# Patient Record
Sex: Male | Born: 1935 | Race: White | Hispanic: No | Smoking: Former smoker
Health system: Southern US, Community
[De-identification: ages and names within clinical notes are randomized; demographics above are authoritative.]

## PROBLEM LIST (undated history)

## (undated) DIAGNOSIS — C4490 Unspecified malignant neoplasm of skin, unspecified: Secondary | ICD-10-CM

## (undated) DIAGNOSIS — H9319 Tinnitus, unspecified ear: Secondary | ICD-10-CM

## (undated) DIAGNOSIS — C801 Malignant (primary) neoplasm, unspecified: Secondary | ICD-10-CM

## (undated) HISTORY — DX: Tinnitus, unspecified ear: H93.19

## (undated) HISTORY — PX: EYE SURGERY: SHX253

## (undated) HISTORY — PX: APPENDECTOMY: SHX54

## (undated) HISTORY — PX: COLONOSCOPY: SHX174

## (undated) HISTORY — DX: Unspecified malignant neoplasm of skin, unspecified: C44.90

## (undated) HISTORY — DX: Malignant (primary) neoplasm, unspecified: C80.1

## (undated) HISTORY — PX: TONSILLECTOMY: SUR1361

---

## 2002-11-23 ENCOUNTER — Ambulatory Visit (HOSPITAL_COMMUNITY): Admission: RE | Admit: 2002-11-23 | Discharge: 2002-11-23 | Payer: Self-pay | Admitting: *Deleted

## 2002-11-23 ENCOUNTER — Encounter: Payer: Self-pay | Admitting: *Deleted

## 2015-12-07 ENCOUNTER — Other Ambulatory Visit: Payer: Self-pay | Admitting: Otolaryngology

## 2015-12-07 ENCOUNTER — Other Ambulatory Visit: Payer: Self-pay | Admitting: Specialist

## 2015-12-07 ENCOUNTER — Other Ambulatory Visit (HOSPITAL_COMMUNITY)
Admission: RE | Admit: 2015-12-07 | Discharge: 2015-12-07 | Disposition: A | Discharge status: Home / Self Care | Payer: Medicare Other | Source: Ambulatory Visit | Attending: Otolaryngology | Admitting: Otolaryngology

## 2015-12-07 ENCOUNTER — Ambulatory Visit
Admission: RE | Admit: 2015-12-07 | Discharge: 2015-12-07 | Disposition: A | Discharge status: Home / Self Care | Payer: Medicare Other | Source: Ambulatory Visit | Attending: Otolaryngology | Admitting: Otolaryngology

## 2015-12-07 DIAGNOSIS — K118 Other diseases of salivary glands: Secondary | ICD-10-CM

## 2015-12-07 DIAGNOSIS — R220 Localized swelling, mass and lump, head: Secondary | ICD-10-CM | POA: Diagnosis present

## 2015-12-07 MED ORDER — IOPAMIDOL (ISOVUE-300) INJECTION 61%
75.0000 mL | Freq: Once | INTRAVENOUS | Status: AC | PRN
  Administered 2015-12-07: 75 mL via INTRAVENOUS

## 2015-12-14 ENCOUNTER — Ambulatory Visit
Admission: RE | Admit: 2015-12-14 | Discharge: 2015-12-14 | Disposition: A | Discharge status: Home / Self Care | Payer: Medicare Other | Source: Ambulatory Visit | Attending: Otolaryngology | Admitting: Otolaryngology

## 2015-12-14 ENCOUNTER — Other Ambulatory Visit: Payer: Self-pay | Admitting: Otolaryngology

## 2015-12-14 DIAGNOSIS — Z01818 Encounter for other preprocedural examination: Secondary | ICD-10-CM

## 2015-12-14 NOTE — H&P (Signed)
PREOPERATIVE H&P  Chief Complaint: right parotid mass  HPI: Zachary Lin is a 82 y.o. male who presents for evaluation of slowly enlarging right parotid mass. He first noticed this about a year ago. It's gradually gotten larger. It's not painful and he has normal facial nerve function. FNA of the mass last week was suspicious for malignancy and CT scan  demonstrated a 4 cm parotid mass also suspicious for malignancy. There was no adenopathy in the neck noted on CT scan. Patient gives no history of skin cancers removed the the face or scalp. He's taken to the OR for right parotidectomy with facial nerve dissection.  No past medical history on file. No past surgical history on file. Social History   Social History  . Marital status: Married    Spouse name: N/A  . Number of children: N/A  . Years of education: N/A   Social History Main Topics  . Smoking status: Not on file  . Smokeless tobacco: Not on file  . Alcohol use Not on file  . Drug use: Unknown  . Sexual activity: Not on file   Other Topics Concern  . Not on file   Social History Narrative  . No narrative on file   No family history on file. Allergies not on file Prior to Admission medications   Not on File     Positive ROS: negative  All other systems have been reviewed and were otherwise negative with the exception of those mentioned in the HPI and as above.  Physical Exam: There were no vitals filed for this visit.  General: Alert, no acute distress Oral: Normal oral mucosa and tonsils. IDL clear hypopharynx and larynx Nasal: Clear nasal passages Neck: No palpable adenopathy or thyroid nodules. 3-4 cm right parotid mass Ear: Ear canal is clear with normal appearing TMs Cardiovascular: Regular rate and rhythm, no murmur.  Respiratory: Clear to auscultation Neurologic: Alert and oriented x 3. Normal facial nerve function   Assessment/Plan: right parotid tumor Plan for Procedure(s): RIGHT PAROTIDECTOMY  WITH FACIAL NERVE DISSECTION   Melony Overly, MD 12/14/2015 1:09 PM

## 2015-12-20 ENCOUNTER — Encounter (HOSPITAL_COMMUNITY): Payer: Self-pay | Admitting: *Deleted

## 2015-12-20 NOTE — Progress Notes (Signed)
Mr Zachary Lin reported that Dr Lucia Gaskins instructed him to stop eating or drinking at 6:00 Am on 12/21/15.  I encouraged patient to not eat a heavy breakfast.

## 2015-12-21 ENCOUNTER — Ambulatory Visit (HOSPITAL_COMMUNITY): Payer: Medicare Other | Admitting: Certified Registered Nurse Anesthetist

## 2015-12-21 ENCOUNTER — Encounter (HOSPITAL_COMMUNITY)
Admission: RE | Disposition: A | Discharge status: Home / Self Care | Payer: Self-pay | Source: Ambulatory Visit | Attending: Otolaryngology

## 2015-12-21 ENCOUNTER — Observation Stay (HOSPITAL_COMMUNITY)
Admission: RE | Admit: 2015-12-21 | Discharge: 2015-12-22 | Disposition: A | Discharge status: Home / Self Care | Payer: Medicare Other | Source: Ambulatory Visit | Attending: Otolaryngology | Admitting: Otolaryngology

## 2015-12-21 ENCOUNTER — Encounter (HOSPITAL_COMMUNITY): Payer: Self-pay | Admitting: *Deleted

## 2015-12-21 DIAGNOSIS — C07 Malignant neoplasm of parotid gland: Principal | ICD-10-CM | POA: Diagnosis present

## 2015-12-21 DIAGNOSIS — C770 Secondary and unspecified malignant neoplasm of lymph nodes of head, face and neck: Secondary | ICD-10-CM | POA: Diagnosis not present

## 2015-12-21 DIAGNOSIS — Z23 Encounter for immunization: Secondary | ICD-10-CM | POA: Insufficient documentation

## 2015-12-21 DIAGNOSIS — Z87891 Personal history of nicotine dependence: Secondary | ICD-10-CM | POA: Diagnosis not present

## 2015-12-21 DIAGNOSIS — D490 Neoplasm of unspecified behavior of digestive system: Secondary | ICD-10-CM | POA: Diagnosis present

## 2015-12-21 HISTORY — PX: DIRECT LARYNGOSCOPY: SHX5326

## 2015-12-21 HISTORY — PX: PAROTIDECTOMY: SHX2163

## 2015-12-21 LAB — BASIC METABOLIC PANEL
Anion gap: 8 (ref 5–15)
BUN: 23 mg/dL — AB (ref 6–20)
CALCIUM: 8.9 mg/dL (ref 8.9–10.3)
CO2: 22 mmol/L (ref 22–32)
CREATININE: 0.88 mg/dL (ref 0.61–1.24)
Chloride: 108 mmol/L (ref 101–111)
GFR calc Af Amer: >60 mL/min (ref >60)
GLUCOSE: 99 mg/dL (ref 65–99)
Potassium: 4.2 mmol/L (ref 3.5–5.1)
SODIUM: 138 mmol/L (ref 135–145)

## 2015-12-21 LAB — CBC
HCT: 42.9 % (ref 39.0–52.0)
Hemoglobin: 14.5 g/dL (ref 13.0–17.0)
MCH: 31.8 pg (ref 26.0–34.0)
MCHC: 33.8 g/dL (ref 30.0–36.0)
MCV: 94.1 fL (ref 78.0–100.0)
Platelets: 162 10*3/uL (ref 150–400)
RBC: 4.56 MIL/uL (ref 4.22–5.81)
RDW: 13.4 % (ref 11.5–15.5)
WBC: 6 10*3/uL (ref 4.0–10.5)

## 2015-12-21 SURGERY — EXCISION, PAROTID GLAND
Anesthesia: General | Laterality: Right

## 2015-12-21 MED ORDER — LIDOCAINE-EPINEPHRINE 1 %-1:100000 IJ SOLN
INTRAMUSCULAR | Status: AC
  Filled 2015-12-21: qty 1

## 2015-12-21 MED ORDER — LIDOCAINE-EPINEPHRINE 1 %-1:100000 IJ SOLN
INTRAMUSCULAR | Status: DC | PRN
  Administered 2015-12-21: 5 mL

## 2015-12-21 MED ORDER — ONDANSETRON HCL 4 MG/2ML IJ SOLN
INTRAMUSCULAR | Status: DC | PRN
  Administered 2015-12-21: 4 mg via INTRAVENOUS

## 2015-12-21 MED ORDER — HYDROMORPHONE HCL 1 MG/ML IJ SOLN
0.2500 mg | INTRAMUSCULAR | Status: DC | PRN
  Administered 2015-12-21 (×2): 0.5 mg via INTRAVENOUS

## 2015-12-21 MED ORDER — ACETAMINOPHEN 325 MG PO TABS: 650.0000 mg | ORAL_TABLET | Freq: Four times a day (QID) | ORAL | Status: DC | PRN

## 2015-12-21 MED ORDER — MIDAZOLAM HCL 2 MG/2ML IJ SOLN: 0.5000 mg | Freq: Once | INTRAMUSCULAR | Status: DC | PRN

## 2015-12-21 MED ORDER — GLYCOPYRROLATE 0.2 MG/ML IJ SOLN
INTRAMUSCULAR | Status: DC | PRN
  Administered 2015-12-21: 0.2 mg via INTRAVENOUS

## 2015-12-21 MED ORDER — ECHINACEA 125 MG PO CAPS: ORAL_CAPSULE | Freq: Every day | ORAL | Status: DC

## 2015-12-21 MED ORDER — PROPOFOL 10 MG/ML IV BOLUS
INTRAVENOUS | Status: AC
  Filled 2015-12-21: qty 20

## 2015-12-21 MED ORDER — ROCURONIUM BROMIDE 10 MG/ML (PF) SYRINGE
PREFILLED_SYRINGE | INTRAVENOUS | Status: AC
  Filled 2015-12-21: qty 10

## 2015-12-21 MED ORDER — VITAMIN C 500 MG PO TABS
500.0000 mg | ORAL_TABLET | Freq: Every day | ORAL | Status: DC
  Administered 2015-12-21: 500 mg via ORAL
  Filled 2015-12-21: qty 1

## 2015-12-21 MED ORDER — PROPOFOL 10 MG/ML IV BOLUS
INTRAVENOUS | Status: DC | PRN
  Administered 2015-12-21 (×2): 30 mg via INTRAVENOUS
  Administered 2015-12-21: 20 mg via INTRAVENOUS
  Administered 2015-12-21: 200 mg via INTRAVENOUS
  Administered 2015-12-21: 20 mg via INTRAVENOUS

## 2015-12-21 MED ORDER — PHENYLEPHRINE HCL 10 MG/ML IJ SOLN
INTRAMUSCULAR | Status: DC | PRN
  Administered 2015-12-21: 50 ug/min via INTRAVENOUS

## 2015-12-21 MED ORDER — BACITRACIN ZINC 500 UNIT/GM EX OINT
TOPICAL_OINTMENT | CUTANEOUS | Status: AC
  Filled 2015-12-21: qty 28.35

## 2015-12-21 MED ORDER — BACITRACIN ZINC 500 UNIT/GM EX OINT
1.0000 "application " | TOPICAL_OINTMENT | Freq: Three times a day (TID) | CUTANEOUS | Status: DC
  Administered 2015-12-22: 1 via TOPICAL
  Filled 2015-12-21: qty 28.35

## 2015-12-21 MED ORDER — SUCCINYLCHOLINE CHLORIDE 20 MG/ML IJ SOLN
INTRAMUSCULAR | Status: DC | PRN
  Administered 2015-12-21: 100 mg via INTRAVENOUS
  Administered 2015-12-21: 60 mg via INTRAVENOUS
  Administered 2015-12-21: 100 mg via INTRAVENOUS

## 2015-12-21 MED ORDER — FENTANYL CITRATE (PF) 100 MCG/2ML IJ SOLN
INTRAMUSCULAR | Status: AC
  Filled 2015-12-21: qty 2

## 2015-12-21 MED ORDER — SUCCINYLCHOLINE CHLORIDE 200 MG/10ML IV SOSY
PREFILLED_SYRINGE | INTRAVENOUS | Status: AC
  Filled 2015-12-21: qty 10

## 2015-12-21 MED ORDER — LIDOCAINE HCL (CARDIAC) 20 MG/ML IV SOLN
INTRAVENOUS | Status: DC | PRN
  Administered 2015-12-21: 30 mg via INTRAVENOUS

## 2015-12-21 MED ORDER — ONDANSETRON HCL 4 MG/2ML IJ SOLN: 4.0000 mg | INTRAMUSCULAR | Status: DC | PRN

## 2015-12-21 MED ORDER — CEFAZOLIN IN D5W 1 GM/50ML IV SOLN
1.0000 g | Freq: Three times a day (TID) | INTRAVENOUS | Status: DC
  Administered 2015-12-21 – 2015-12-22 (×2): 1 g via INTRAVENOUS
  Filled 2015-12-21 (×4): qty 50

## 2015-12-21 MED ORDER — MORPHINE SULFATE (PF) 2 MG/ML IV SOLN: 2.0000 mg | INTRAVENOUS | Status: DC | PRN

## 2015-12-21 MED ORDER — SAW PALMETTO (SERENOA REPENS) 160 MG PO CAPS: 160.0000 mg | ORAL_CAPSULE | Freq: Three times a day (TID) | ORAL | Status: DC

## 2015-12-21 MED ORDER — MEPERIDINE HCL 25 MG/ML IJ SOLN: 6.2500 mg | INTRAMUSCULAR | Status: DC | PRN

## 2015-12-21 MED ORDER — ONDANSETRON HCL 4 MG/2ML IJ SOLN
INTRAMUSCULAR | Status: AC
  Filled 2015-12-21: qty 2

## 2015-12-21 MED ORDER — LACTATED RINGERS IV SOLN
INTRAVENOUS | Status: DC
  Administered 2015-12-21 (×2): via INTRAVENOUS

## 2015-12-21 MED ORDER — BACITRACIN ZINC 500 UNIT/GM EX OINT
TOPICAL_OINTMENT | CUTANEOUS | Status: DC | PRN
  Administered 2015-12-21: 1 via TOPICAL

## 2015-12-21 MED ORDER — CEFAZOLIN SODIUM-DEXTROSE 2-4 GM/100ML-% IV SOLN
INTRAVENOUS | Status: AC
  Filled 2015-12-21: qty 100

## 2015-12-21 MED ORDER — CEFAZOLIN SODIUM-DEXTROSE 2-4 GM/100ML-% IV SOLN
2.0000 g | INTRAVENOUS | Status: AC
  Administered 2015-12-21: 2 g via INTRAVENOUS

## 2015-12-21 MED ORDER — HYDROCODONE-ACETAMINOPHEN 5-325 MG PO TABS: 1.0000 | ORAL_TABLET | ORAL | Status: DC | PRN

## 2015-12-21 MED ORDER — CEFAZOLIN IN D5W 1 GM/50ML IV SOLN
1.0000 g | Freq: Three times a day (TID) | INTRAVENOUS | Status: DC
  Filled 2015-12-21 (×2): qty 50

## 2015-12-21 MED ORDER — ONDANSETRON HCL 4 MG PO TABS: 4.0000 mg | ORAL_TABLET | ORAL | Status: DC | PRN

## 2015-12-21 MED ORDER — HYDROMORPHONE HCL 1 MG/ML IJ SOLN
INTRAMUSCULAR | Status: AC
Start: 2015-12-21 — End: 2015-12-22
  Filled 2015-12-21: qty 1

## 2015-12-21 MED ORDER — FENTANYL CITRATE (PF) 100 MCG/2ML IJ SOLN
INTRAMUSCULAR | Status: DC | PRN
  Administered 2015-12-21: 50 ug via INTRAVENOUS
  Administered 2015-12-21: 100 ug via INTRAVENOUS
  Administered 2015-12-21: 50 ug via INTRAVENOUS

## 2015-12-21 MED ORDER — PHENYLEPHRINE HCL 10 MG/ML IJ SOLN
INTRAMUSCULAR | Status: DC | PRN
  Administered 2015-12-21 (×2): 40 ug via INTRAVENOUS

## 2015-12-21 MED ORDER — 0.9 % SODIUM CHLORIDE (POUR BTL) OPTIME
TOPICAL | Status: DC | PRN
  Administered 2015-12-21: 1000 mL

## 2015-12-21 MED ORDER — PNEUMOCOCCAL VAC POLYVALENT 25 MCG/0.5ML IJ INJ
0.5000 mL | INJECTION | INTRAMUSCULAR | Status: AC
  Administered 2015-12-22: 0.5 mL via INTRAMUSCULAR
  Filled 2015-12-21: qty 0.5

## 2015-12-21 MED ORDER — PROMETHAZINE HCL 25 MG/ML IJ SOLN: 6.2500 mg | INTRAMUSCULAR | Status: DC | PRN

## 2015-12-21 MED ORDER — LIDOCAINE 2% (20 MG/ML) 5 ML SYRINGE
INTRAMUSCULAR | Status: AC
  Filled 2015-12-21: qty 5

## 2015-12-21 MED ORDER — IBUPROFEN 100 MG/5ML PO SUSP: 400.0000 mg | Freq: Four times a day (QID) | ORAL | Status: DC | PRN

## 2015-12-21 SURGICAL SUPPLY — 51 items
BLADE SURG 12 STRL SS (BLADE) ×3 IMPLANT
CANISTER SUCTION 2500CC (MISCELLANEOUS) ×3 IMPLANT
CLEANER TIP ELECTROSURG 2X2 (MISCELLANEOUS) ×3 IMPLANT
CONT SPEC 4OZ CLIKSEAL STRL BL (MISCELLANEOUS) ×3 IMPLANT
CORDS BIPOLAR (ELECTRODE) ×3 IMPLANT
COVER SURGICAL LIGHT HANDLE (MISCELLANEOUS) ×3 IMPLANT
DRAIN HEMOVAC 7FR (DRAIN) IMPLANT
DRAIN SNY 10 ROU (WOUND CARE) ×3 IMPLANT
DRAPE SURG 17X11 SM STRL (DRAPES) ×3 IMPLANT
ELECT COATED BLADE 2.86 ST (ELECTRODE) ×3 IMPLANT
ELECT REM PT RETURN 9FT ADLT (ELECTROSURGICAL) ×3
ELECTRODE REM PT RTRN 9FT ADLT (ELECTROSURGICAL) ×2 IMPLANT
EVACUATOR SILICONE 100CC (DRAIN) ×3 IMPLANT
GAUZE SPONGE 4X4 12PLY STRL (GAUZE/BANDAGES/DRESSINGS) ×3 IMPLANT
GAUZE SPONGE 4X4 16PLY XRAY LF (GAUZE/BANDAGES/DRESSINGS) ×6 IMPLANT
GLOVE BIO SURGEON STRL SZ8 (GLOVE) ×3 IMPLANT
GLOVE BIOGEL PI IND STRL 8.5 (GLOVE) ×2 IMPLANT
GLOVE BIOGEL PI INDICATOR 8.5 (GLOVE) ×1
GLOVE SS BIOGEL STRL SZ 7.5 (GLOVE) ×4 IMPLANT
GLOVE SUPERSENSE BIOGEL SZ 7.5 (GLOVE) ×2
GLOVE SURG SS PI 6.5 STRL IVOR (GLOVE) ×3 IMPLANT
GLOVE SURG SS PI 7.5 STRL IVOR (GLOVE) ×3 IMPLANT
GOWN STRL REUS W/ TWL LRG LVL3 (GOWN DISPOSABLE) ×2 IMPLANT
GOWN STRL REUS W/ TWL XL LVL3 (GOWN DISPOSABLE) ×4 IMPLANT
GOWN STRL REUS W/TWL LRG LVL3 (GOWN DISPOSABLE) ×1
GOWN STRL REUS W/TWL XL LVL3 (GOWN DISPOSABLE) ×2
KIT BASIN OR (CUSTOM PROCEDURE TRAY) ×3 IMPLANT
KIT ROOM TURNOVER OR (KITS) ×3 IMPLANT
LOCATOR NERVE 3 VOLT (DISPOSABLE) ×3 IMPLANT
NEEDLE HYPO 25GX1X1/2 BEV (NEEDLE) ×3 IMPLANT
NS IRRIG 1000ML POUR BTL (IV SOLUTION) ×3 IMPLANT
PAD ARMBOARD 7.5X6 YLW CONV (MISCELLANEOUS) ×6 IMPLANT
PENCIL BUTTON HOLSTER BLD 10FT (ELECTRODE) ×3 IMPLANT
SPONGE INTESTINAL PEANUT (DISPOSABLE) ×6 IMPLANT
SPONGE LAP 18X18 X RAY DECT (DISPOSABLE) ×3 IMPLANT
STAPLER VISISTAT 35W (STAPLE) ×3 IMPLANT
SUT CHROMIC 3 0 PS 2 (SUTURE) ×6 IMPLANT
SUT ETHILON 5 0 P 3 18 (SUTURE) ×1
SUT ETHILON 8 0 BV130 4 (SUTURE) IMPLANT
SUT NYLON ETHILON 5-0 P-3 1X18 (SUTURE) ×2 IMPLANT
SUT SILK 2 0 (SUTURE) ×1
SUT SILK 2 0 FS (SUTURE) ×6 IMPLANT
SUT SILK 2-0 18XBRD TIE 12 (SUTURE) ×2 IMPLANT
SUT SILK 3 0 (SUTURE) ×1
SUT SILK 3-0 18XBRD TIE 12 (SUTURE) ×2 IMPLANT
SUT SILK 4 0 (SUTURE)
SUT SILK 4-0 18XBRD TIE 12 (SUTURE) IMPLANT
SUT VIC AB 3-0 FS2 27 (SUTURE) IMPLANT
TAPE CLOTH SURG 4X10 WHT LF (GAUZE/BANDAGES/DRESSINGS) ×3 IMPLANT
TOWEL OR 17X24 6PK STRL BLUE (TOWEL DISPOSABLE) ×3 IMPLANT
TRAY ENT MC OR (CUSTOM PROCEDURE TRAY) ×3 IMPLANT

## 2015-12-21 NOTE — Brief Op Note (Signed)
12/21/2015  4:43 PM  PATIENT:  Delmer Islam  82 y.o. male  PRE-OPERATIVE DIAGNOSIS:  right parotid tumor  POST-OPERATIVE DIAGNOSIS:  right parotid tumor  PROCEDURE:  Procedure(s): RIGHT PAROTIDECTOMY WITH FACIAL NERVE DISSECTION (Right) DIRECT LARYNGOSCOPY  SURGEON:  Surgeon(s) and Role:    * Rozetta Nunnery, MD - Primary    * Thornell Sartorius, MD - Assisting  PHYSICIAN ASSISTANT:   ASSISTANTS: Minna Merritts   ANESTHESIA:   general  EBL:  Total I/O In: 1000 [I.V.:1000] Out: 100 [Blood:100]  BLOOD ADMINISTERED:none  DRAINS: (10) Jackson-Pratt drain(s) with closed bulb suction in the right parotid   LOCAL MEDICATIONS USED:  Amount: 5 ml  SPECIMEN:  Source of Specimen:  right parotid gland  DISPOSITION OF SPECIMEN:  PATHOLOGY  COUNTS:  YES  TOURNIQUET:  * No tourniquets in log *  DICTATION: .Other Dictation: Dictation Number [id number]  PLAN OF CARE: Admit for overnight observation  PATIENT DISPOSITION:  PACU - hemodynamically stable.   Delay start of Pharmacological VTE agent (>24hrs) due to surgical blood loss or risk of bleeding: yes

## 2015-12-21 NOTE — Anesthesia Postprocedure Evaluation (Signed)
Anesthesia Post Note  Patient: Education officer, community  Procedure(s) Performed: Procedure(s) (LRB): RIGHT PAROTIDECTOMY WITH FACIAL NERVE DISSECTION (Right) DIRECT LARYNGOSCOPY  Patient location during evaluation: PACU Anesthesia Type: General Level of consciousness: awake and alert, oriented and patient cooperative Pain management: pain level controlled Vital Signs Assessment: post-procedure vital signs reviewed and stable Respiratory status: spontaneous breathing, nonlabored ventilation, respiratory function stable and patient connected to nasal cannula oxygen Cardiovascular status: blood pressure returned to baseline and stable Postop Assessment: no signs of nausea or vomiting Anesthetic complications: no    Last Vitals:  Vitals:   12/21/15 1658 12/21/15 1746  BP: 122/69   Pulse: (!) 52   Resp: 11   Temp:  36.7 C    Last Pain:  Vitals:   12/21/15 1058  TempSrc: Oral                 Marcel Sorter,E. Posie Lillibridge

## 2015-12-21 NOTE — Anesthesia Preprocedure Evaluation (Addendum)
Anesthesia Evaluation  Patient identified by MRN, date of birth, ID band Patient awake    Reviewed: Allergy & Precautions, NPO status , Patient's Chart, lab work & pertinent test results  History of Anesthesia Complications Negative for: history of anesthetic complications  Airway Mallampati: I  TM Distance: >3 FB     Dental  (+) Caps, Dental Advisory Given, Chipped   Pulmonary former smoker,    breath sounds clear to auscultation       Cardiovascular negative cardio ROS   Rhythm:Regular Rate:Normal     Neuro/Psych negative neurological ROS     GI/Hepatic negative GI ROS, Neg liver ROS,   Endo/Other  negative endocrine ROS  Renal/GU negative Renal ROS     Musculoskeletal  (+) Arthritis , Osteoarthritis,    Abdominal   Peds  Hematology negative hematology ROS (+)   Anesthesia Other Findings   Reproductive/Obstetrics                            Anesthesia Physical Anesthesia Plan  ASA: II  Anesthesia Plan: General   Post-op Pain Management:    Induction: Intravenous  Airway Management Planned: Oral ETT  Additional Equipment:   Intra-op Plan:   Post-operative Plan: Extubation in OR  Informed Consent: I have reviewed the patients History and Physical, chart, labs and discussed the procedure including the risks, benefits and alternatives for the proposed anesthesia with the patient or authorized representative who has indicated his/her understanding and acceptance.   Dental advisory given  Plan Discussed with: CRNA and Surgeon  Anesthesia Plan Comments: (Plan routine monitors, GETA)        Anesthesia Quick Evaluation

## 2015-12-21 NOTE — Anesthesia Procedure Notes (Addendum)
Procedure Name: Intubation Date/Time: 12/21/2015 1:42 PM Performed by: Sampson Si E Pre-anesthesia Checklist: Patient identified, Emergency Drugs available, Suction available, Patient being monitored and Timeout performed Patient Re-evaluated:Patient Re-evaluated prior to inductionOxygen Delivery Method: Circle system utilized Preoxygenation: Pre-oxygenation with 100% oxygen Intubation Type: IV induction Ventilation: Mask ventilation without difficulty Laryngoscope Size: Mac and 3 Grade View: Grade II Tube type: Oral Tube size: 7.5 mm Number of attempts: 1 Airway Equipment and Method: Stylet Placement Confirmation: ETT inserted through vocal cords under direct vision,  positive ETCO2 and breath sounds checked- equal and bilateral Secured at: 22 cm Tube secured with: Tape Dental Injury: Teeth and Oropharynx as per pre-operative assessment and Injury to lip

## 2015-12-21 NOTE — Op Note (Signed)
NAMEFRANCISO, DINGUS              ACCOUNT NO.:  [id number]  MEDICAL RECORD NO.:  [id number]  LOCATION:  [facility]                         FACILITY:  [facility]  PHYSICIAN:  Leonides Sake. Lucia Gaskins, M.D.DATE OF BIRTH:  08-Dec-1932  DATE OF PROCEDURE:  12/21/2015 DATE OF DISCHARGE:                              OPERATIVE REPORT   PREOPERATIVE DIAGNOSIS:  Right parotid tumor.  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  OPERATION PERFORMED:  Right parotidectomy with facial nerve dissection. Direct laryngoscopy.  SURGEON:  Leonides Sake. Lucia Gaskins, M.D.  ASSISTANT:  Minna Merritts, M.D.  ANESTHESIA:  General endotracheal.  ESTIMATED BLOOD LOSS:  80 mL.  COMPLICATIONS:  None.  BRIEF CLINICAL NOTE:  Jovon Letter is an 82 year old gentleman who has had a slowly enlarging right upper neck mass, now for about a year.  He gives no history of cancer and gives no history of significant skin cancers on the right side of his face.  Upper airway exam was normal; however, on CT scan and fine needle aspirate, findings were consistent with a 4-cm right intraparotid mass with fine needle aspirate suspicious for malignant cells.  He has normal facial nerve function and is taken to the operating room at this time for wide excision of right parotid mass with facial nerve dissection.  DESCRIPTION OF PROCEDURE:  After adequate endotracheal anesthesia, the patient received 2 g of Ancef IV preoperatively.  The right face and neck were prepped with Betadine solution and draped out with sternal towels.  A standard parotid incision was marked out and injected with 5 mL of Xylocaine with epinephrine for hemostasis.  A standard parotid incision was made just in front of the tragus around the earlobe and down into the neck.  The patient had a fairly firm large mass just behind the angle of the jaw just deep to the sternocleidomastoid  muscle. Flaps were elevated anteriorly and superiorly.  The mass extended into the sternocleidomastoid muscle and the anterior portion of the sternocleidomastoid muscle was excised along with the mass.  The marginal branch of the facial nerve was identified anteriorly just inferior to the angle of the jaw.  Next, dissection was carried along the cartilage down to the styloid process, which was identified.  The mass basically abutted the mastoid tip in order to get down this area. The mass had to be dissected directly off the periosteum of the mastoid tip.  Next, the trunk of the facial nerve was identified and dissected out and resided in its normal anatomical position just inferior to the styloid process.  The branch of the facial nerve divided and the superior branch.  Superior portion of the facial nerve was uninvolved, however, the marginal branch of the facial nerve was adjacent to the tumor, but the tumor had not invaded the marginal branch of the facial nerve.  This was carefully dissected off.  The mass extended back into the sternocleidomastoid muscle very close proximity to the accessory nerve.  The mass was grossly totally excised with gross negative margins.  However, at one portion, the mass expelled some tissue that was sent for frozen section and this frozen section revealed malignant cells  consistent with possible squamous cell carcinoma.  A small lymph node in the superficial parotid was removed and sent as a separate specimen.  After excising the parotid mass, this was sent as right parotid tumor.  Hemostasis was obtained with the bipolar cautery. Several large facial veins were ligated with 2-0 and 3-0 silk sutures. Adequate hemostasis was obtained.  Then, the defect was closed after passing a 10-French JP drain through an incision posterior to the ear and the defect was closed with 2-0 chromic sutures subcutaneously and 5- 0 nylon to reapproximate the skin edges.   Bacitracin ointment and dressing were applied.  Because frozen section revealing squamous cell carcinoma, direct laryngoscopy was performed at completion of the case. The patient had previous tonsillectomy and this area was clear, base of tongue was clear.  No palpable masses in the base of tongue, vallecula, epiglottis were normal.  False and true cords were clear.  Piriform sinuses were clear.  Palpation of the nasopharynx was clear.  This completed the direct laryngoscopy and no biopsies were obtained.  The patient was subsequently awakened from anesthesia and transferred to the recovery room postoperatively doing well.  DISPOSITION:  The patient will be admitted for overnight observation and plan of discharge in the morning after removing the JP drain.          ______________________________ Leonides Sake. Lucia Gaskins, M.D.     CEN/MEDQ  D:  12/21/2015  T:  12/21/2015  Job:  [id number]  cc:   Minna Merritts, M.D. Floyde Parkins, M.D.

## 2015-12-21 NOTE — Transfer of Care (Signed)
Immediate Anesthesia Transfer of Care Note  Patient: Zachary Lin  Procedure(s) Performed: Procedure(s): RIGHT PAROTIDECTOMY WITH FACIAL NERVE DISSECTION (Right) DIRECT LARYNGOSCOPY  Patient Location: PACU  Anesthesia Type:General  Level of Consciousness: awake, alert  and oriented  Airway & Oxygen Therapy: Patient Spontanous Breathing and Patient connected to nasal cannula oxygen  Post-op Assessment: Report given to RN  Post vital signs: Reviewed and stable  Last Vitals:  Vitals:   12/21/15 1058  BP: (!) 169/86  Pulse: (!) 53  Resp: 18  Temp: 36.5 C    Last Pain:  Vitals:   12/21/15 1058  TempSrc: Oral      Patients Stated Pain Goal: 3 ([id number])  Complications: No apparent anesthesia complications

## 2015-12-21 NOTE — Interval H&P Note (Signed)
History and Physical Interval Note:  12/21/2015 1:30 PM  Zachary Lin  has presented today for surgery, with the diagnosis of right parotid tumor  The various methods of treatment have been discussed with the patient and family. After consideration of risks, benefits and other options for treatment, the patient has consented to  Procedure(s): RIGHT PAROTIDECTOMY WITH FACIAL NERVE DISSECTION (Right) as a surgical intervention .  The patient's history has been reviewed, patient examined, no change in status, stable for surgery.  I have reviewed the patient's chart and labs.  Questions were answered to the patient's satisfaction.     CHRISTOPHER NEWMAN

## 2015-12-22 ENCOUNTER — Encounter (HOSPITAL_COMMUNITY): Payer: Self-pay | Admitting: Otolaryngology

## 2015-12-22 DIAGNOSIS — C07 Malignant neoplasm of parotid gland: Secondary | ICD-10-CM | POA: Diagnosis not present

## 2015-12-22 MED ORDER — HYDROCODONE-ACETAMINOPHEN 5-325 MG PO TABS: 1.0000 | ORAL_TABLET | Freq: Four times a day (QID) | ORAL | 0 refills | Status: DC | PRN

## 2015-12-22 NOTE — Progress Notes (Signed)
POD 1 AF VSS Doing well with minimal complaints. Normal facial nerve function JP output 65 cc yesterday. Minimal today and removed. Discharge to Home Continue regular meds and motrin or hydrocodone 5 mg prn pain Follow up my office in 6 days

## 2015-12-22 NOTE — Progress Notes (Signed)
D/C papers gone over with pt. Pain prescription was given to pt. By MD. IV taken out.  Dressing changed. JP  Drain was removed by MD. No questions/ complaints. Pt. D/c'd successfully with his wife.

## 2015-12-22 NOTE — Discharge Summary (Signed)
NAMEJOVONN, Zachary Lin              ACCOUNT NO.:  [id number]  MEDICAL RECORD NO.:  [id number]  LOCATION:  6N29C                        FACILITY:  [facility]  PHYSICIAN:  Leonides Sake. Lucia Gaskins, M.D.DATE OF BIRTH:  1932-10-07  DATE OF ADMISSION:  12/21/2015 DATE OF DISCHARGE:  12/22/2015                              DISCHARGE SUMMARY   DIAGNOSIS:  Right parotid cancer.  POSTOPERATIVE DIAGNOSIS:  Right parotid cancer.  OPERATION PERFORMED:  Right parotidectomy with facial nerve dissection on December 21, 2015.  HOSPITAL COURSE:  The patient was admitted via the operating room on December 21, 2015, having previously undergone a CT scan and fine needle aspirate of a 4-cm right parotid mass that was consistent with a malignant neoplasm.  He was taken to the operating room for right parotidectomy and facial nerve dissection.  He had normal facial nerve function prior to the procedure.  The patient underwent an uncomplicated excision of the mass which was grossly entirely removed but was very close to the nerve as well as close to the mastoid tip extending down inferior to the tail of the parotid gland and in the neck.  Following excision, the patient was admitted to hospital with JP drain and received perioperative antibiotics, Ancef.  JP drain had minimal output on the first postoperative day and was subsequently removed and the patient was discharged home.  Wound was doing well with minimal swelling.  He had normal facial nerve function.  He is tolerating p.o. as well.  Afebrile and vital signs stable.  DISPOSITION:  The patient is discharged home on Tylenol, Motrin and hydrocodone 5/325 mg tablets, 1-2 q.6 hours p.r.n. pain.  Will follow up in my office in 6 days for recheck to have sutures removed.  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          ______________________________ Leonides Sake. Lucia Gaskins,  M.D.     CEN/MEDQ  D:  12/22/2015  T:  12/22/2015  Job:  [id number]

## 2015-12-22 NOTE — Care Management Note (Signed)
Case Management Note  Patient Details  Name: Zachary Lin MRN: [id number] Date of Birth: Sep 14, 1932  Subjective/Objective:    Right parotidecoomy with facial nerve dissection               Action/Plan: Chart reviewed. No NCM needs identified.   Expected Discharge Date:  12/22/2015              Expected Discharge Plan:  Home/Self Care  In-House Referral:     Discharge planning Services  CM Consult  Post Acute Care Choice:  NA Choice offered to:  NA  DME Arranged:  N/A DME Agency:  NA  HH Arranged:  NA HH Agency:  NA  Status of Service:  Completed, signed off  If discussed at [place] of Stay Meetings, dates discussed:    Additional Comments:  Erenest Rasher, RN 12/22/2015, 10:36 AM

## 2015-12-26 ENCOUNTER — Other Ambulatory Visit (HOSPITAL_COMMUNITY): Payer: Self-pay | Admitting: Otolaryngology

## 2015-12-26 DIAGNOSIS — D37030 Neoplasm of uncertain behavior of the parotid salivary glands: Secondary | ICD-10-CM

## 2016-01-03 ENCOUNTER — Ambulatory Visit (HOSPITAL_COMMUNITY)
Admission: RE | Admit: 2016-01-03 | Discharge: 2016-01-03 | Disposition: A | Discharge status: Home / Self Care | Payer: Medicare Other | Source: Ambulatory Visit | Attending: Otolaryngology | Admitting: Otolaryngology

## 2016-01-03 DIAGNOSIS — R911 Solitary pulmonary nodule: Secondary | ICD-10-CM | POA: Insufficient documentation

## 2016-01-03 DIAGNOSIS — C07 Malignant neoplasm of parotid gland: Secondary | ICD-10-CM | POA: Diagnosis not present

## 2016-01-03 DIAGNOSIS — J439 Emphysema, unspecified: Secondary | ICD-10-CM | POA: Insufficient documentation

## 2016-01-03 DIAGNOSIS — I700 Atherosclerosis of aorta: Secondary | ICD-10-CM | POA: Diagnosis not present

## 2016-01-03 DIAGNOSIS — D37030 Neoplasm of uncertain behavior of the parotid salivary glands: Secondary | ICD-10-CM

## 2016-01-03 DIAGNOSIS — I2510 Atherosclerotic heart disease of native coronary artery without angina pectoris: Secondary | ICD-10-CM | POA: Insufficient documentation

## 2016-01-03 LAB — GLUCOSE, CAPILLARY: Glucose-Capillary: 94 mg/dL (ref 65–99)

## 2016-01-03 MED ORDER — FLUDEOXYGLUCOSE F - 18 (FDG) INJECTION
7.5000 | Freq: Once | INTRAVENOUS | Status: AC | PRN
  Administered 2016-01-03: 7.5 via INTRAVENOUS

## 2016-01-09 ENCOUNTER — Telehealth: Payer: Self-pay | Admitting: *Deleted

## 2016-01-09 NOTE — Progress Notes (Signed)
Head and Neck Cancer Location of Tumor / Histology:  12/07/15 Diagnosis PAROTID GLAND, RIGHT, FINE NEEDLE ASPIRATION (SPECIMEN 1 OF 1 COLLECTED 12/07/2015): HIGHLY ATYPICAL CELLS WITH NECROTIC DEBRIS, SUSPICIOUS FOR MALIGNANCY. 12/21/15   Diagnosis 1. Lymph node, biopsy, Parotid - ONE BENIGN LYMPH NODE WITH NO TUMOR SEEN (0/1). 2. Salivary gland, biopsy, Portion of Parotid Mass - HIGH GRADE (POORLY DIFFERENTIATED) CARCINOMA. 3. Parotid gland - HIGH GRADE (POORLY DIFFERENTIATED) CARCINOMA, SPANNING 4.2 CM IN GREATEST DIMENSION. - TUMOR INVOLVES MULTIPLE AREAS OF THE EXCISION MARGIN. - TUMOR INVOLVES SOFT TISSUE BEYOND PAROTID GLAND. - TWO LYMPH NODES PRESENT WITH ONE POSITIVE FOR METASTATIC POORLY DIFFERENTIATED CARCINOMA (1/2). - SEE ONCOLOGY TEMPLATE AND COMMENT.  Patient presented with symptoms of: Per Dr. Pollie Friar note 12/14/15, A slowly enlarging right parotid mass. He first noticed this about a year ago. It's gradually gotten larger. It's not painful and he has normal facial nerve function.  Biopsies of Salivary gland (portion of Parotid Mass)  and Parotid Gland revealed: High Grade (poorly differentiated) Carcinoma. Two lymph nodes presents that are positive for metastatic poorly differentiated carcinoma   Nutrition Status Yes No Comments  Weight changes? []  [x]    Swallowing concerns? []  [x]    PEG? []  [x]     Referrals Yes No Comments  Social Work? []  [x]    Dentistry? []  [x]    Swallowing therapy? []  [x]    Nutrition? []  [x]    Med/Onc? []  [x]     Safety Issues Yes No Comments  Prior radiation? []  [x]    Pacemaker/ICD? []  [x]    Possible current pregnancy? []  [x]  N/A  Is the patient on methotrexate? []  [x]     Tobacco/Marijuana/Snuff/ETOH use: He is a former smoker, quit 30 years ago. He consumes one beer daily.   Past/Anticipated interventions by otolaryngology, if any:  12/21/15 PROCEDURE:  Procedure(s): RIGHT PAROTIDECTOMY WITH FACIAL NERVE DISSECTION (Right) DIRECT  LARYNGOSCOPY  SURGEON:  Surgeon(s) and Role:    * Rozetta Nunnery, MD - Primary    * Thornell Sartorius, MD - Assisting  His surgery site has healed well.   Past/Anticipated interventions by medical oncology, if any: None  BP (!) 172/87   Pulse 61   Temp 97.7 F (36.5 C)   Ht 5\' 7"  (1.702 m)   Wt 154 lb 8 oz (70.1 kg)   SpO2 100% Comment: room air  BMI 24.20 kg/m     Current Complaints / other details:  None

## 2016-01-09 NOTE — Telephone Encounter (Signed)
Oncology Nurse Navigator Documentation  Placed new referral introductory phone call to Mr. Antilla, LVM asking him to return my call.  Gayleen Orem, RN, BSN, [place] at [place] [phone]

## 2016-01-09 NOTE — Telephone Encounter (Signed)
Oncology Nurse Navigator Documentation  Mr. Mcquown returned my call from earlier today.   Introduced myself as the H&N oncology nurse navigator that works with Dr. Isidore Moos to whom he has been referred by Dr. Lucia Gaskins.  He confirmed his understanding of referral; I informed him of 12:30 NE and 1:00 consult appointment with Dr. Isidore Moos this Wednesday.  I briefly explained my role as his navigator, indicated that I would be joining him   during his appt next week.  I confirmed his understanding of [place] location, explained arrival and RadOnc registration process for appt.  I provided my contact information, encouraged him to call with questions/concerns before next week.  He verbalized understanding of information provided, expressed appreciation for my call.  Gayleen Orem, RN, BSN, [place] at [place] [phone]

## 2016-01-11 ENCOUNTER — Telehealth (HOSPITAL_COMMUNITY): Payer: Self-pay

## 2016-01-11 NOTE — Telephone Encounter (Signed)
09/13/1Called and spoke w/patient regarding scheduling Dental Consult w/Dr. Enrique Sack on 01/12/16 and pt. refused. Patient will schedule appt. at a later date. LRI

## 2016-01-13 ENCOUNTER — Ambulatory Visit
Admission: RE | Admit: 2016-01-13 | Discharge: 2016-01-13 | Disposition: A | Discharge status: Home / Self Care | Payer: Medicare Other | Source: Ambulatory Visit | Attending: Radiation Oncology | Admitting: Radiation Oncology

## 2016-01-13 ENCOUNTER — Encounter: Payer: Self-pay | Admitting: Radiation Oncology

## 2016-01-13 ENCOUNTER — Encounter: Payer: Self-pay | Admitting: *Deleted

## 2016-01-13 DIAGNOSIS — Z87891 Personal history of nicotine dependence: Secondary | ICD-10-CM | POA: Diagnosis not present

## 2016-01-13 DIAGNOSIS — C07 Malignant neoplasm of parotid gland: Secondary | ICD-10-CM | POA: Insufficient documentation

## 2016-01-13 NOTE — Progress Notes (Signed)
Radiation Oncology         [phone]  Radiation Oncology         [phone] ________________________________  Initial outpatient Consultation  Name: Zachary Lin MRN: [id number]  Date: 01/13/2016  DOB: 05/31/1932  CC:Pcp Not In System  [place], Leonides Sake, *   REFERRING PHYSICIAN: Rozetta Nunnery, *  DIAGNOSIS:    ICD-9-CM ICD-10-CM   1. 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 male who per Dr. Pollie Friar 12/14/15 note presented with a slowly enlarging right parotid mass. He first noted the mass a year ago. It had gradually gotten larger. It was not painful and he had normal facial nerve function.  Subsequently, the patient saw Dr. Lucia Gaskins who performed right parotidectomy with facial nerve dissection on 12/21/15. 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  Pertinent imaging thus far includes CT neck on 12-07-15 showing a 4.1 cm R parotid mass and PET scan performed on 01/03/16 revealing at minimum post operative inflammation although the radiologist seems to favor gross residual disease. However, the PET scan was performed 2 weeks after surgery. Therefore, it was at high risk for false positives.  Swallowing issues, if any: no  Weight Changes: no  Other symptoms: none  Tobacco history, if any: He is a former smoker, he quit 30 years ago  ETOH abuse, if any: He consumes one beer daily  He is a Insurance underwriter, still working. Discussed case at tumor board. He is without complaints.  PREVIOUS RADIATION THERAPY: No  PAST MEDICAL HISTORY:  has no past medical history on file.    PAST SURGICAL  HISTORY: Past Surgical History:  Procedure Laterality Date  . APPENDECTOMY    . COLONOSCOPY    . DIRECT LARYNGOSCOPY  12/21/2015   Procedure: DIRECT LARYNGOSCOPY;  Surgeon: Rozetta Nunnery, MD;  Location: [facility];  Service: ENT;;  . EYE SURGERY Bilateral    cataract with Lens  . PAROTIDECTOMY Right 12/21/2015   Procedure: RIGHT PAROTIDECTOMY WITH FACIAL NERVE DISSECTION;  Surgeon: Rozetta Nunnery, MD;  Location: [facility];  Service: ENT;  Laterality: Right;  . TONSILLECTOMY      FAMILY HISTORY: family history is not on file.  SOCIAL HISTORY:  reports that he has quit smoking. He has a 90.00 pack-year smoking history. He has never used smokeless tobacco. He reports that he drinks about 0.6 oz of alcohol per week .  ALLERGIES: Review of patient's allergies indicates no known allergies.  MEDICATIONS:  Current Outpatient Prescriptions  Medication Sig Dispense Refill  . Capsicum, Cayenne, (CAYENNE PO) Take by mouth.    . Echinacea 125 MG CAPS Take by mouth daily.    . saw palmetto 160 MG capsule Take 160 mg by mouth 3 (three) times daily with meals.    . tadalafil (CIALIS) 20 MG tablet Take 20 mg by mouth daily as needed for erectile dysfunction.    . vitamin C (ASCORBIC ACID) 500 MG tablet Take 500 mg by mouth daily.     No current facility-administered medications for this encounter.     REVIEW OF SYSTEMS:  Notable for that above.   PHYSICAL EXAM:  height is 5\' 7"  (  1.702 m) and weight is 154 lb 8 oz (70.1 kg). His temperature is 97.7 F (36.5 C). His blood pressure is 172/87 (abnormal) and his pulse is 61. His oxygen saturation is 100%.   General: Alert and oriented, in no acute distress HEENT: Head is normocephalic. Extraocular movements are intact. Oropharynx is notable for no lesions in the oral cavity or oral pharynx. No facial droop. Neck: Neck is notable for no palpable masses in the preauricular, postauricular, cervical, or supraclavicular regions. Scars have healed well  from surgery. Heart: Regular in rate and rhythm with no murmurs, rubs, or gallops. Chest: Clear to auscultation bilaterally, with no rhonchi, wheezes, or rales. Abdomen: Soft, nontender, nondistended, with no rigidity or guarding. Extremities: No clubbing, cyanosis, or edema. Lymphatics: see Neck Exam Skin: No concerning lesions. Musculoskeletal: symmetric strength and muscle tone throughout. Neurologic: Cranial nerves II through XII are grossly intact. No obvious focalities. Speech is fluent. Coordination is intact. Psychiatric: Judgment and insight are intact. Affect is appropriate.   ECOG = 0  0 - Asymptomatic (Fully active, able to carry on all predisease activities without restriction)  1 - Symptomatic but completely ambulatory (Restricted in physically strenuous activity but ambulatory and able to carry out work of a light or sedentary nature. For example, light housework, office work)  2 - Symptomatic, <50% in bed during the day (Ambulatory and capable of all self care but unable to carry out any work activities. Up and about more than 50% of waking hours)  3 - Symptomatic, >50% in bed, but not bedbound (Capable of only limited self-care, confined to bed or chair 50% or more of waking hours)  4 - Bedbound (Completely disabled. Cannot carry on any self-care. Totally confined to bed or chair)  5 - Death   Eustace Pen MM, Creech RH, Tormey DC, et al. [phone]). "Toxicity and response criteria of the [facility]". [place] Oncol. 5 (6): 649-55   LABORATORY DATA:  Lab Results  Component Value Date   WBC 6.0 12/21/2015   HGB 14.5 12/21/2015   HCT 42.9 12/21/2015   MCV 94.1 12/21/2015   PLT 162 12/21/2015   CMP     Component Value Date/Time   NA 138 12/21/2015 1105   K 4.2 12/21/2015 1105   CL 108 12/21/2015 1105   CO2 22 12/21/2015 1105   GLUCOSE 99 12/21/2015 1105   BUN 23 (H) 12/21/2015 1105   CREATININE 0.88 12/21/2015 1105   CALCIUM 8.9 12/21/2015  1105   GFRNONAA >60 12/21/2015 1105   GFRAA >60 12/21/2015 1105         RADIOGRAPHY: Nm Pet Image Initial (pi) Skull Base To Thigh  Result Date: 01/03/2016 CLINICAL DATA:  Initial treatment strategy for poorly differentiated parotid gland carcinoma resected 12/21/2015. EXAM: NUCLEAR MEDICINE PET SKULL BASE TO THIGH TECHNIQUE: 7.5 mCi F-18 FDG was injected intravenously. Full-ring PET imaging was performed from the skull base to thigh after the radiotracer. CT data was obtained and used for attenuation correction and anatomic localization. FASTING BLOOD GLUCOSE:  Value: 94 mg/dl COMPARISON:  12/07/2015 neck CT.  12/14/2015 chest radiograph. FINDINGS: NECK There is infiltrative soft tissue density in the right parotid resection site measuring 2.4 x 2.4 cm in maximum axial dimension (series 4/ image 29) with associated intense hypermetabolism (max SUV 11.1). There is a 0.6 cm right level 2 hypermetabolic lymph node (series 4/image 42) with max SUV 6.5. No additional hypermetabolic lymph nodes in the neck. CHEST No hypermetabolic axillary, mediastinal or  hilar nodes. Coarsely calcified left hilar nodes from prior granulomatous disease. Left main, left anterior descending and right coronary atherosclerosis. Atherosclerotic nonaneurysmal thoracic aorta. No pleural effusions. Moderate centrilobular emphysema and diffuse bronchial wall thickening. Calcified subcentimeter granuloma in the lingula. Anterior left lower lobe 4 mm solid pulmonary nodule associated with the left major fissure. No acute consolidative airspace disease, lung masses or additional significant pulmonary nodules. ABDOMEN/PELVIS No abnormal hypermetabolic activity within the liver, pancreas, adrenal glands, or spleen. No hypermetabolic lymph nodes in the abdomen or pelvis. Scattered granulomatous calcifications throughout the normal size spleen. SKELETON No focal hypermetabolic activity to suggest skeletal metastasis. IMPRESSION: 1. Intensely  hypermetabolic infiltrative soft tissue in the right parotid resection site, most consistent with residual tumor. 2. Hypermetabolic right level 2 nodal metastasis. 3. No distant hypermetabolic metastatic disease. 4. Solitary subpleural 4 mm left lower lobe pulmonary nodule, probably benign. Recommend attention on a follow-up unenhanced chest CT in 3-6 months. 5. Additional findings include aortic atherosclerosis, coronary atherosclerosis and moderate emphysema. Electronically Signed   By: Ilona Sorrel M.D.   On: 01/03/2016 16:51      IMPRESSION/PLAN: This is a delightful patient with high risk parotid head and neck cancer.  Discussed at tumor board. Surgeon feels that clearing margins would result in facial paralysis. I recommend 6.5 - 7 weeks of radiotherapy for this patient.  We discussed the potential risks, benefits, and side effects of radiotherapy. We talked in detail about acute and late effects. We discussed that some of the most bothersome acute effects may be mucositis, dysgeusia, salivary changes, skin irritation, hair loss, dehydration, weight loss and fatigue. We talked about late effects which include but are not necessarily limited to  Internal organ injury, xerostomia, trismus, and neck edema. No guarantees of treatment were given.  The patient is enthusiastic about proceeding with treatment.    The following referrals have been or will be made:   Dentistry for dental evaluation. Will need scatter guards.   I will refer for an early morning consult appointment with Dr. Orlene Erm of rad/onc in [place]. We had a very lengthy discussion about the logistics of radiation therapy. The patient is concerned about the inconvenience of treatment, including the commute. He will need at least 6.5-7weeks of radiation therapy. I would recommend aggressive blocking of oral cavity to prevent dysgeusia. Given that he lives in [place], I recommend he see Dr. Orlene Erm for treatment down there. We will make a  referral. He is agreeable.    __________________________________________   Eppie Gibson, MD   This document serves as a record of services personally performed by Eppie Gibson, MD. It was created on her behalf by Bethann Humble, a trained medical scribe. The creation of this record is based on the scribe's personal observations and the provider's statements to them. This document has been checked and approved by the attending provider.

## 2016-01-13 NOTE — Progress Notes (Signed)
Oncology Nurse Navigator Documentation  Met with Zachary Lin during initial consult with Dr. Isidore Moos.  H was unaccompanied.    1. Further introduced myself as his Navigator, explained my role as a member of the Care Team.   2. Provided New Patient Information packet, discussed contents:  Contact information for physician(s), myself, other members of the Care Team.  Advance Directive information ([place] blue pamphlet with LCSW contact info)  Fall Prevention Patient [place] sheet  [facility] map with highlight of [place] 3. Provided introductory explanation of radiation treatment including SIM planning and purpose of Aquaplast head and shoulder mask, showed them example.   4. He indicated preference to be treated in [place], Dr. Isidore Moos to refer him to Dr. Orlene Erm. 5. He understands he can contact me with questions/concerns during transition of care.  Gayleen Orem, RN, BSN, [facility] [phone]

## 2016-01-24 ENCOUNTER — Telehealth: Payer: Self-pay | Admitting: *Deleted

## 2016-01-24 ENCOUNTER — Encounter (HOSPITAL_COMMUNITY): Payer: Self-pay | Admitting: Dentistry

## 2016-01-24 ENCOUNTER — Ambulatory Visit (HOSPITAL_COMMUNITY): Payer: Self-pay | Admitting: Dentistry

## 2016-01-24 ENCOUNTER — Encounter (INDEPENDENT_AMBULATORY_CARE_PROVIDER_SITE_OTHER): Payer: Self-pay

## 2016-01-24 VITALS — BP 140/74 | HR 46 | Temp 97.8°F

## 2016-01-24 DIAGNOSIS — IMO0002 Reserved for concepts with insufficient information to code with codable children: Secondary | ICD-10-CM

## 2016-01-24 DIAGNOSIS — K0850 Unsatisfactory restoration of tooth, unspecified: Secondary | ICD-10-CM

## 2016-01-24 DIAGNOSIS — M270 Developmental disorders of jaws: Secondary | ICD-10-CM

## 2016-01-24 DIAGNOSIS — K0530 Chronic periodontitis, unspecified: Secondary | ICD-10-CM

## 2016-01-24 DIAGNOSIS — Z01818 Encounter for other preprocedural examination: Secondary | ICD-10-CM | POA: Diagnosis not present

## 2016-01-24 DIAGNOSIS — K030 Excessive attrition of teeth: Secondary | ICD-10-CM

## 2016-01-24 DIAGNOSIS — M264 Malocclusion, unspecified: Secondary | ICD-10-CM

## 2016-01-24 DIAGNOSIS — C07 Malignant neoplasm of parotid gland: Secondary | ICD-10-CM | POA: Diagnosis present

## 2016-01-24 DIAGNOSIS — K0889 Other specified disorders of teeth and supporting structures: Secondary | ICD-10-CM

## 2016-01-24 DIAGNOSIS — K036 Deposits [accretions] on teeth: Secondary | ICD-10-CM

## 2016-01-24 DIAGNOSIS — K08409 Partial loss of teeth, unspecified cause, unspecified class: Secondary | ICD-10-CM

## 2016-01-24 DIAGNOSIS — K031 Abrasion of teeth: Secondary | ICD-10-CM

## 2016-01-24 MED ORDER — SODIUM FLUORIDE 1.1 % DT GEL: DENTAL | 99 refills | Status: DC

## 2016-01-24 MED FILL — FLUORISHIELD 1.1% GEL: 1.1 % | 30 days supply | Qty: 114 | Fill #0

## 2016-01-24 NOTE — Patient Instructions (Signed)

## 2016-01-24 NOTE — Telephone Encounter (Signed)
CALLED PATIENT TO INFORM OF APPT. WITH DR. PALERMO ON 01-26-16- 8 AM - NURSE AND 8:30 AM - DR. PALERMO, LVM FOR A RETURN CALL

## 2016-01-24 NOTE — Progress Notes (Signed)
DENTAL CONSULTATION  Date of Consultation:  01/24/2016 Patient Name:   Zachary Lin Date of Birth:   01/04/1933 Medical Record Number: [id number]  VITALS: BP 140/74 (BP Location: Left Arm)   Pulse (!) 46   Temp 97.8 F (36.6 C) (Oral)   CHIEF COMPLAINT: Patient referred by Dr. Isidore Moos for a dental consultation.   HPI: Zachary Lin is an 82 year old male recently diagnosed with cancer of the parotid gland. Patient had a parotidectomy on 12/21/2015 with Dr. Lucia Gaskins. Patient with anticipated postoperative radiation therapy. Patient is now seen as part of a medically necessary preradiation therapy dental protocol examination.    Patient currently denies acute toothaches, swellings, or abscesses. Patient denies having any sinus problems or upper right quadrant tooth sensitivity. Patient denies having any loose teeth. Patient was last seen for an exam and cleaning with Dr. Geanie Logan in May 2017.  Patient is usually seen on an every 6 month basis. Patient denies having any partial dentures. Patient denies having dental phobia.  PROBLEM LIST: Patient Active Problem List   Diagnosis Date Noted  . Cancer of parotid gland ([place]) 12/21/2015    Priority: High    PMH: Past Medical History:  Diagnosis Date  . Tinnitus     PSH: Past Surgical History:  Procedure Laterality Date  . APPENDECTOMY    . COLONOSCOPY    . DIRECT LARYNGOSCOPY  12/21/2015   Procedure: DIRECT LARYNGOSCOPY;  Surgeon: Rozetta Nunnery, MD;  Location: [facility];  Service: ENT;;  . EYE SURGERY Bilateral    cataract with Lens  . PAROTIDECTOMY Right 12/21/2015   Procedure: RIGHT PAROTIDECTOMY WITH FACIAL NERVE DISSECTION;  Surgeon: Rozetta Nunnery, MD;  Location: [facility];  Service: ENT;  Laterality: Right;  . TONSILLECTOMY      ALLERGIES: No Known Allergies  MEDICATIONS: Current Outpatient Prescriptions  Medication Sig Dispense Refill  . Capsicum, Cayenne, (CAYENNE PO) Take by mouth.    . Echinacea 125 MG  CAPS Take by mouth daily.    . saw palmetto 160 MG capsule Take 160 mg by mouth 3 (three) times daily with meals.    . tadalafil (CIALIS) 20 MG tablet Take 20 mg by mouth daily as needed for erectile dysfunction.    . vitamin C (ASCORBIC ACID) 500 MG tablet Take 500 mg by mouth daily.     No current facility-administered medications for this visit.     LABS: Lab Results  Component Value Date   WBC 6.0 12/21/2015   HGB 14.5 12/21/2015   HCT 42.9 12/21/2015   MCV 94.1 12/21/2015   PLT 162 12/21/2015      Component Value Date/Time   NA 138 12/21/2015 1105   K 4.2 12/21/2015 1105   CL 108 12/21/2015 1105   CO2 22 12/21/2015 1105   GLUCOSE 99 12/21/2015 1105   BUN 23 (H) 12/21/2015 1105   CREATININE 0.88 12/21/2015 1105   CALCIUM 8.9 12/21/2015 1105   GFRNONAA >60 12/21/2015 1105   GFRAA >60 12/21/2015 1105   No results found for: INR, PROTIME No results found for: PTT  SOCIAL HISTORY: Social History   Social History  . Marital status: Married    Spouse name: N/A  . Number of children: N/A  . Years of education: N/A   Occupational History  . Not on file.   Social History Main Topics  . Smoking status: Former Smoker    Packs/day: 2.00    Years: 45.00  . Smokeless tobacco: Never Used  Comment: 12/20/15- quit 30 years ago  . Alcohol use 0.6 oz/week    1 Cans of beer per week  . Drug use: No  . Sexual activity: Not on file   Other Topics Concern  . Not on file   Social History Narrative   Patient is divorced.   Patient has 4 children.   Live in [place], [place], [place], [place], [place].    FAMILY HISTORY: History reviewed. No pertinent family history.  REVIEW OF SYSTEMS: Reviewed with patient as per history of present illness. Psych: Patient denies dental phobia  DENTAL HISTORY: CHIEF COMPLAINT: Patient referred by Dr. Isidore Moos for a dental consultation.   HPI: Zachary Lin is an 82 year old male recently diagnosed with cancer of the parotid  gland. Patient had a parotidectomy on 12/21/2015 with Dr. Lucia Gaskins. Patient with anticipated postoperative radiation therapy. Patient is now seen as part of a medically necessary preradiation therapy dental protocol examination.    Patient currently denies acute toothaches, swellings, or abscesses. Patient denies having any sinus problems or upper right quadrant tooth sensitivity. Patient denies having any loose teeth. Patient was last seen for an exam and cleaning with Dr. Geanie Logan in May 2017.  Patient is usually seen on an every 6 month basis. Patient denies having any partial dentures. Patient denies having dental phobia.  DENTAL EXAMINATION: GENERAL: The patient is a well-developed, well-nourished male in no acute distress. HEAD AND NECK: The right neck is consistent with previous parotidectomy on 12/21/2015. There is no left neck lymphadenopathy. The patient denies acute TMJ symptoms. INTRAORAL EXAM: Patient has normal saliva. The patient has bilateral mandibular lingual tori. The patient has a large mid palatal torus. There is no evidence of oral abscess formation. DENTITION: The patient is missing tooth numbers 1, 4, 7, 12-17, 31, and 32. Patient has generalized maxillary and mandibular mandibular occlusal and incisal attrition. PERIODONTAL: Patient has chronic periodontitis with plaque and calculus accumulations, gingival recession, and mandibular anterior incipient tooth mobility. There is incipient to moderate bone loss noted. DENTAL CARIES/SUBOPTIMAL RESTORATIONS: No obvious dental caries are noted. Patient has multiple abfraction/flexure lesions. Patient has several suboptimal dental restorations. ENDODONTIC: Patient currently denies acute pulpitis symptoms. Patient has previous root canal therapies associated with tooth numbers 3, 6, and 8. No obvious persistent periapical pathology or radiolucency is noted. No obvious percussion sensitivity associated with tooth numbers 2, 3, 5,6, and  8. CROWN AND BRIDGE: There are no crown or bridge restorations noted. The crown on tooth #11 has a suboptimal mesial lingual margin and should be evaluated for replacement in the future. PROSTHODONTIC: Patient denies having partial dentures. OCCLUSION: Patient has a poor occlusal scheme secondary to multiple missing teeth, supra-eruption and drifting of the unopposed teeth into the edentulous areas, and generalized attrition, and lack of replacement of missing teeth with dental prostheses.  RADIOGRAPHIC INTERPRETATION: A panoramic was taken and supplemented with 2 periapical radiographs. There are multiple missing teeth. There is incipient to moderate bone loss. There is supra-eruption and drifting of the unopposed teeth into the edentulous areas. There are root canal therapies associated with tooth numbers 3, 6, and 8. There are multiple crown or bridge restorations noted. There are no obvious periapical radiolucencies. There are bilateral maxillary sinuses that appear to be well aerated. There are radiopacities of the lower mandible consistent with bilateral mandibular lingual tori.  ASSESSMENTS: 1. Cancer of the right parotid gland status post parotidectomy 2. Preradiation therapy dental protocol 3. Generalized incisal and occlusal attrition 4. Maxillary palatal torus  5. Bilateral mandibular lingual tori 6. Multiple flexure lesions 7. Suboptimal dental restorations 8. Chronic periodontitis with bone loss 9. Gingival recession excellent  10. Accretions 11. Mandibular anterior incipient tooth mobility 12. Multiple missing teeth 13. Supra-eruption and drifting of the unopposed teeth into the edentulous areas 14. Poor occlusal scheme and malocclusion  PLAN/RECOMMENDATIONS: 1. I discussed the risks, benefits, and complications of various treatment options with the patient in relationship to his medical and dental conditions, anticipated radiation therapy, and radiation therapy side effects to  include xerostomia, radiation caries, trismus, mucositis, taste changes, gum and jawbone changes, and risk for infection and osteoradionecrosis. We discussed various treatment options to include no treatment, multiple extractions with alveoloplasty, pre-prosthetic surgery as indicated, periodontal therapy, dental restorations, root canal therapy, crown and bridge therapy, implant therapy, and replacement of missing teeth as indicated. The patient currently is adamant about not wanting dental extractions at this time. Patient does agree to proceed with impressions today for the the fabrication of fluoride trays and scatter protection devices. Prescription for FluoriSHIELD was sent to [facility] outpatient pharmacy.  The patient will return to his primary dentist for dental cleaning prior to start of radiation therapy.   2. Discussion of findings with medical team and coordination of future medical and dental care as needed.    Lenn Cal, DDS

## 2016-01-26 ENCOUNTER — Encounter (HOSPITAL_COMMUNITY): Payer: Self-pay | Admitting: Dentistry

## 2016-01-26 ENCOUNTER — Encounter (INDEPENDENT_AMBULATORY_CARE_PROVIDER_SITE_OTHER): Payer: Self-pay

## 2016-01-26 ENCOUNTER — Ambulatory Visit (HOSPITAL_COMMUNITY): Payer: Self-pay | Admitting: Dentistry

## 2016-01-26 VITALS — BP 157/95 | HR 48 | Temp 97.6°F

## 2016-01-26 DIAGNOSIS — Z01818 Encounter for other preprocedural examination: Secondary | ICD-10-CM

## 2016-01-26 DIAGNOSIS — C07 Malignant neoplasm of parotid gland: Secondary | ICD-10-CM

## 2016-01-26 DIAGNOSIS — Z463 Encounter for fitting and adjustment of dental prosthetic device: Secondary | ICD-10-CM

## 2016-01-26 NOTE — Patient Instructions (Signed)

## 2016-01-26 NOTE — Progress Notes (Signed)
01/26/2016  Patient Name:   Zachary Lin Date of Birth:   1932/07/20 Medical Record Number: [id number]  BP (!) 157/95 (BP Location: Left Arm)   Pulse (!) 48   Temp 97.6 F (36.4 C) (Oral)   [name] now presents for insertion of upper and lower fluoride trays and scatter protection devices. Patient was reminded to call Dr. Huston Foley for dental cleaning appointment.  PROCEDURE: Appliances were tried in and adjusted as needed. [place] Trismus device was previously fabricated at 40 mm using 26 sticks. Postop instructions were provided in a written and verbal format concerning the use and care of appliances. All questions were answered. Patient to call for appointment as needed for oral examination during radiation therapy in 2-3 weeks if so desired. Otherwise, patient will be seen one month after last radiation therapy treatment. Patient to call if questions or problems arise before then.  Lenn Cal, DDS

## 2016-06-07 ENCOUNTER — Other Ambulatory Visit: Payer: Self-pay | Admitting: Otolaryngology

## 2016-08-22 ENCOUNTER — Other Ambulatory Visit (HOSPITAL_COMMUNITY): Payer: Self-pay | Admitting: Otolaryngology

## 2016-08-22 DIAGNOSIS — C44222 Squamous cell carcinoma of skin of right ear and external auricular canal: Secondary | ICD-10-CM

## 2016-08-31 ENCOUNTER — Ambulatory Visit (HOSPITAL_COMMUNITY): Payer: Medicare Other

## 2016-09-14 ENCOUNTER — Ambulatory Visit (HOSPITAL_COMMUNITY)
Admission: RE | Admit: 2016-09-14 | Discharge: 2016-09-14 | Disposition: A | Discharge status: Home / Self Care | Payer: Medicare Other | Source: Ambulatory Visit | Attending: Otolaryngology | Admitting: Otolaryngology

## 2016-09-14 DIAGNOSIS — C44222 Squamous cell carcinoma of skin of right ear and external auricular canal: Secondary | ICD-10-CM | POA: Diagnosis present

## 2016-09-14 LAB — GLUCOSE, CAPILLARY: GLUCOSE-CAPILLARY: 92 mg/dL (ref 65–99)

## 2016-09-14 MED ORDER — FLUDEOXYGLUCOSE F - 18 (FDG) INJECTION
7.6200 | Freq: Once | INTRAVENOUS | Status: AC | PRN
  Administered 2016-09-14: 7.62 via INTRAVENOUS

## 2018-01-23 IMAGING — CT NM PET TUM IMG INITIAL (PI) SKULL BASE T - THIGH
8 series · 21 of 25 positions shown · non-contrast
Comparison: 12/07/2015 neck CT.  12/14/2015 chest radiograph.

CLINICAL DATA: Initial treatment strategy for poorly differentiated
parotid gland carcinoma resected 12/21/2015.

EXAM:
NUCLEAR MEDICINE PET SKULL BASE TO THIGH
TECHNIQUE: 7.5 mCi F-18 FDG was injected intravenously. Full-ring PET imaging
was performed from the skull base to thigh after the radiotracer. CT
data was obtained and used for attenuation correction and anatomic
localization.
FASTING BLOOD GLUCOSE:  Value: 94 mg/dl

[Series 3: pet hn_sk_thigh ac · axial · 5.0mm · 4.07mm/px · z∈[-926,-70]mm · 4 of 215 slices shown]
[im 1/215]
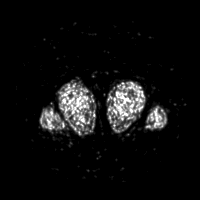
[im 54/215]
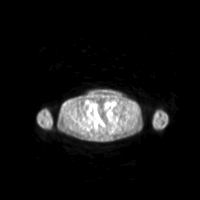
[im 108/215]
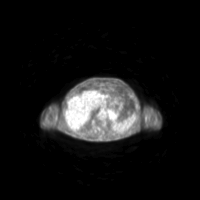
[im 215/215]
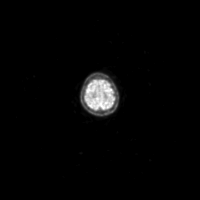

[Series 4: ct hn_sk_th 5.0 b31f · axial · 5.0mm · 0.98mm/px · z∈[-926,-286]mm · 4 of 215 slices shown]
[im 1/215]
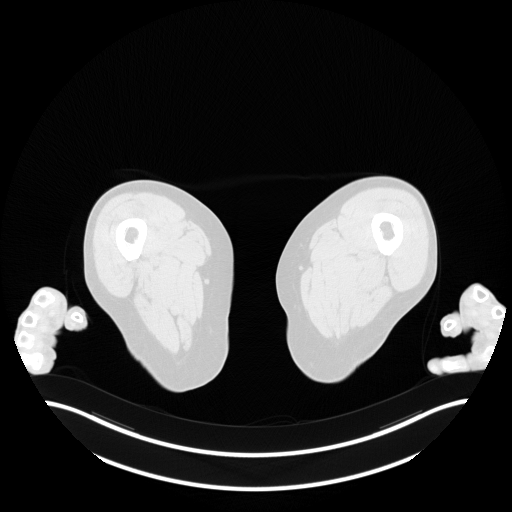
[im 54/215]
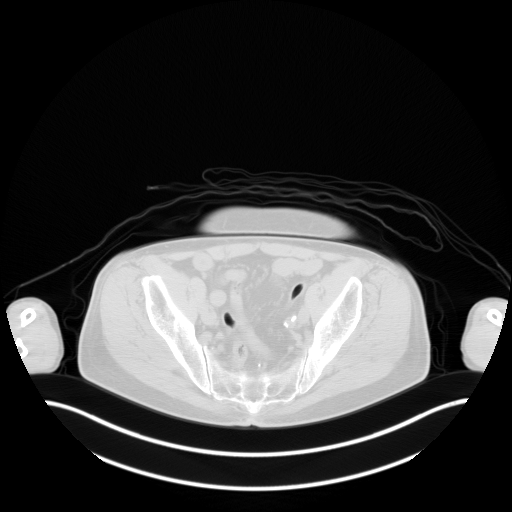
[im 108/215]
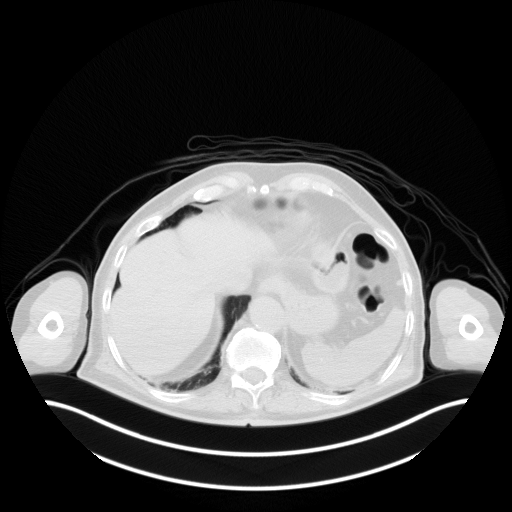
[im 161/215]
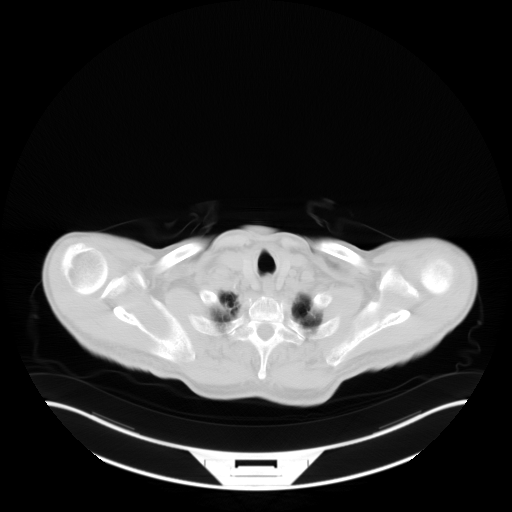

[Series 7: pet hn_sk_thigh nac · axial · 5.0mm · 4.07mm/px · z∈[-926,-70]mm · 5 of 215 slices shown]
[im 1/215]
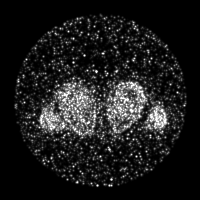
[im 54/215]
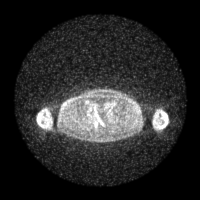
[im 108/215]
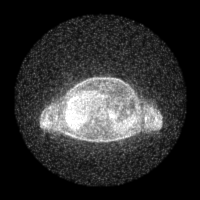
[im 161/215]
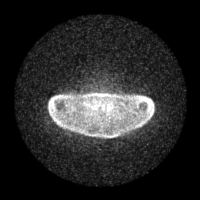
[im 215/215]
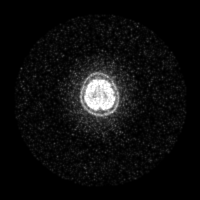

[Series 8: ct hn_sk_th 5.0 b70f lung_bone · axial · 5.0mm · 0.98mm/px · 1 of 75 slices shown]
[im 75/75  bone]
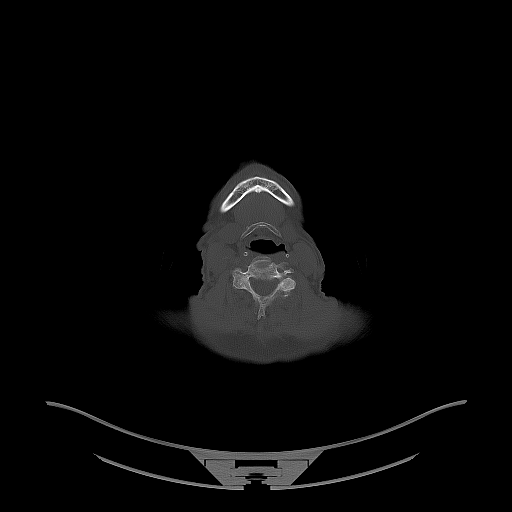

[Series 603: mip collection · coronal · 1.78mm/px · 1 of 32 slices shown]
[im 1/32]
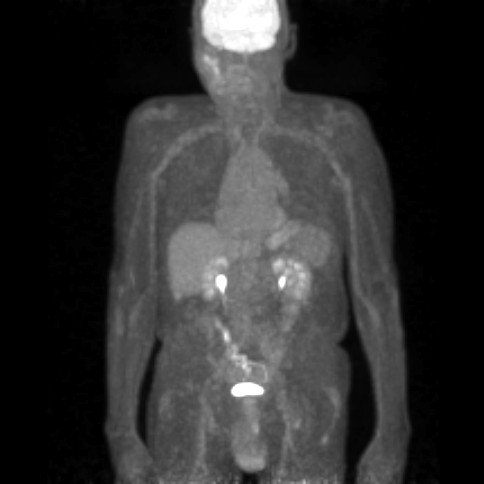

[Series 604: range-ct hn_sk_th 5.0 (id)<alpha range> · 1 of 51 slices shown (1 of 2)]
[im 1/51]
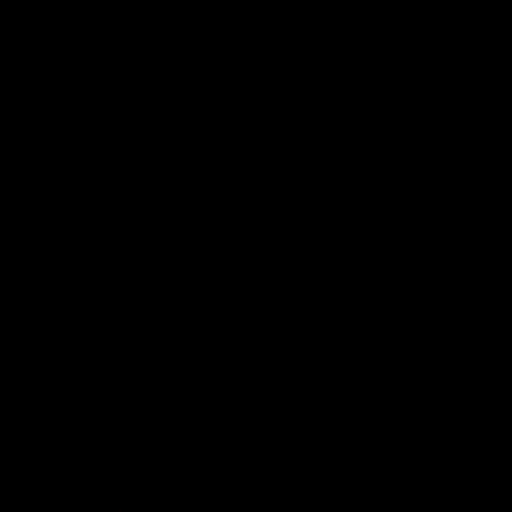

[Series 605: range-ct hn_sk_th 5.0 (id)<alpha range> · 4 of 202 slices shown (2 of 2)]
[im 1/202]
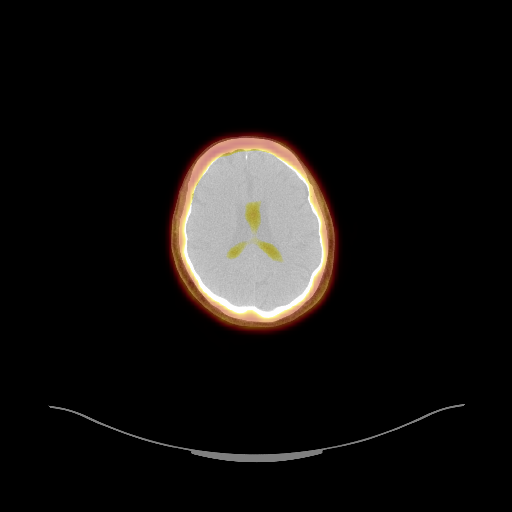
[im 51/202]
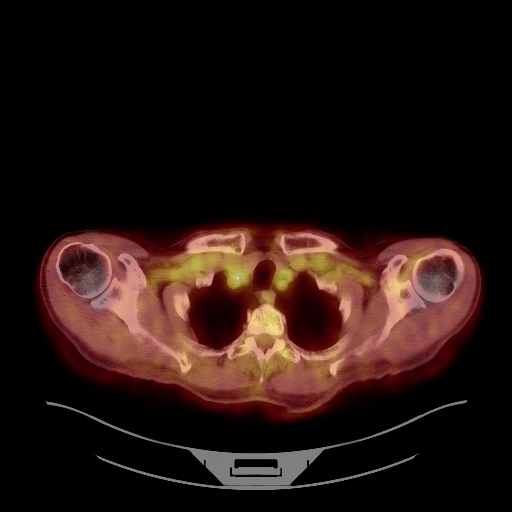
[im 151/202]
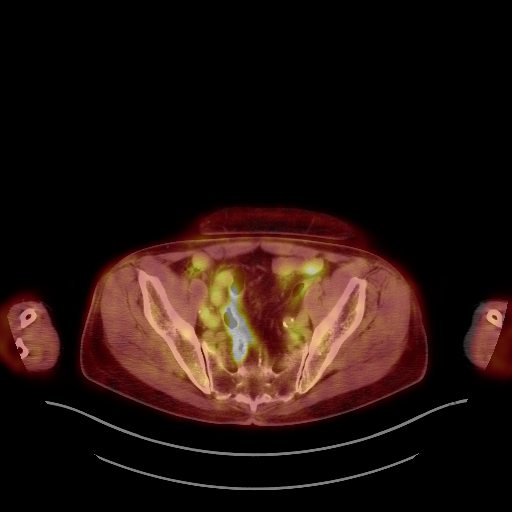
[im 202/202]
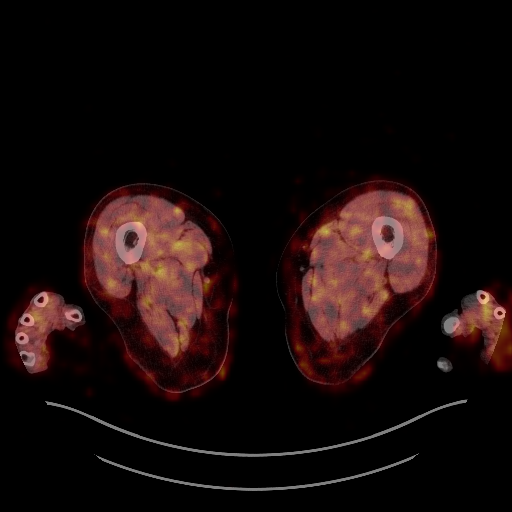

[Series 1032: results mm oncology reading · 0.51mm/px · 1 of 3 slices shown]
[im 1/3]
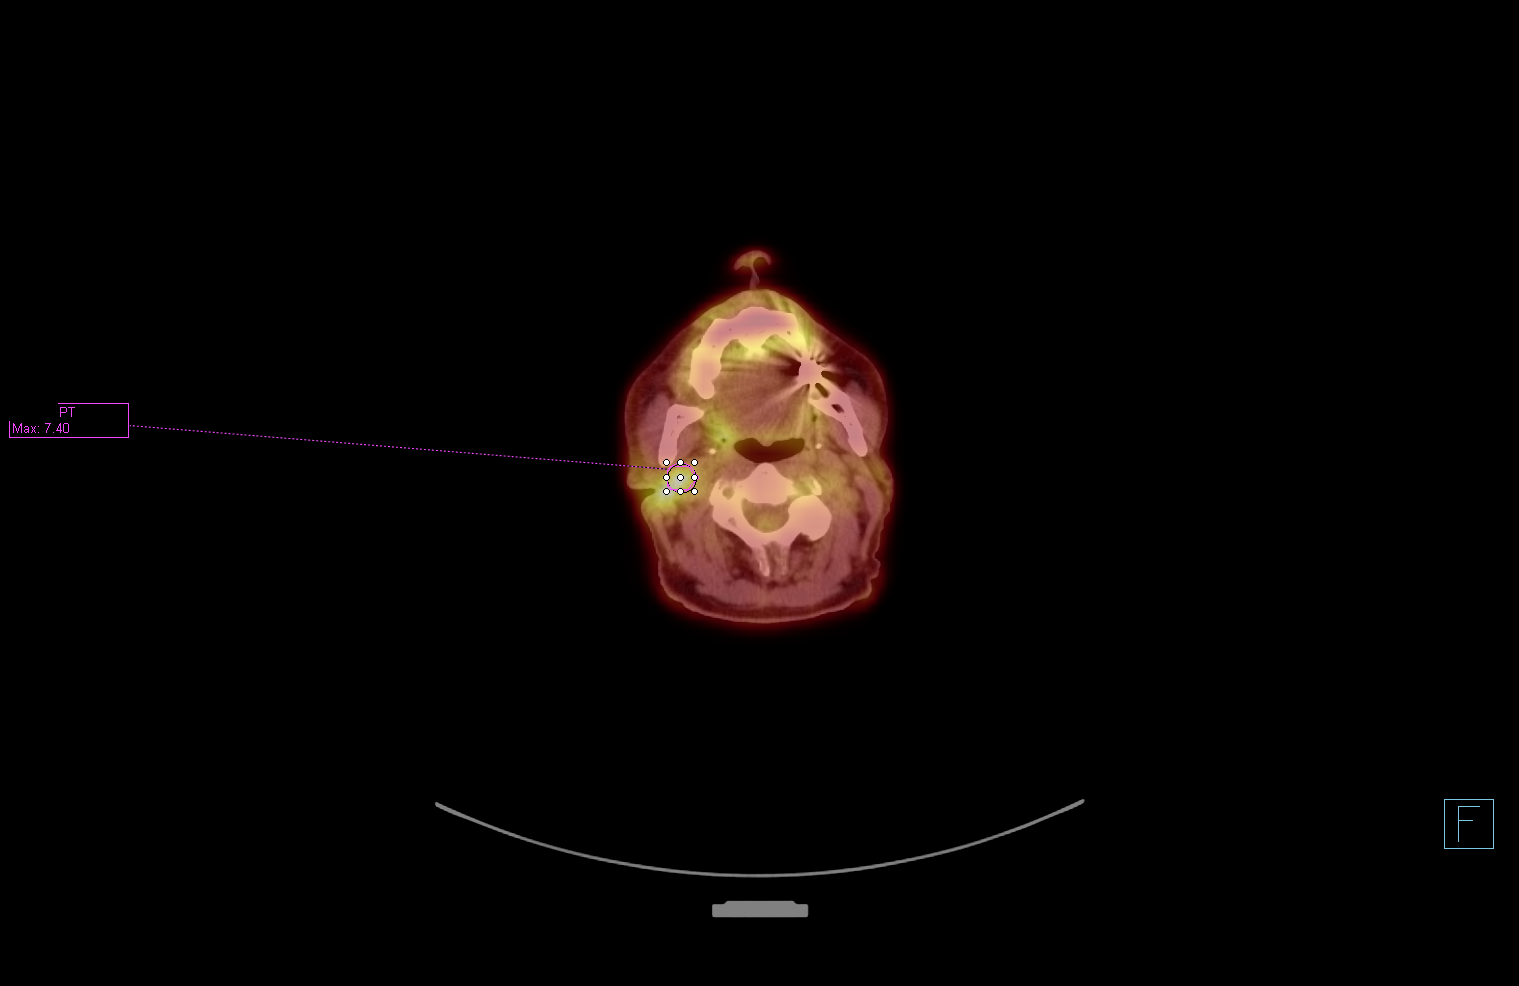

[21 of 25 positions shown; findings below may reference images not displayed]

FINDINGS: NECK

There is infiltrative soft tissue density in the right parotid
resection site measuring 2.4 x 2.4 cm in maximum axial dimension
(series 4/ image 29) with associated intense hypermetabolism (max
SUV 11.1).

There is a 0.6 cm right level 2 hypermetabolic lymph node (series
4/image 42) with max SUV 6.5. No additional hypermetabolic lymph
nodes in the neck.

CHEST

No hypermetabolic axillary, mediastinal or hilar nodes. Coarsely
calcified left hilar nodes from prior granulomatous disease. Left
main, left anterior descending and right coronary atherosclerosis.
Atherosclerotic nonaneurysmal thoracic aorta. No pleural effusions.
Moderate centrilobular emphysema and diffuse bronchial wall
thickening. Calcified subcentimeter granuloma in the lingula.
Anterior left lower lobe 4 mm solid pulmonary nodule associated with
the left major fissure. No acute consolidative airspace disease,
lung masses or additional significant pulmonary nodules.

ABDOMEN/PELVIS

No abnormal hypermetabolic activity within the liver, pancreas,
adrenal glands, or spleen. No hypermetabolic lymph nodes in the
abdomen or pelvis. Scattered granulomatous calcifications throughout
the normal size spleen.

SKELETON

No focal hypermetabolic activity to suggest skeletal metastasis.
IMPRESSION: 1. Intensely hypermetabolic infiltrative soft tissue in the right
parotid resection site, most consistent with residual tumor.
2. Hypermetabolic right level 2 nodal metastasis.
3. No distant hypermetabolic metastatic disease.
4. Solitary subpleural 4 mm left lower lobe pulmonary nodule,
probably benign. Recommend attention on a follow-up unenhanced chest
CT in 3-6 months.
5. Additional findings include aortic atherosclerosis, coronary
atherosclerosis and moderate emphysema.

## 2019-08-27 ENCOUNTER — Encounter (INDEPENDENT_AMBULATORY_CARE_PROVIDER_SITE_OTHER): Payer: Self-pay | Admitting: Otolaryngology

## 2019-08-27 ENCOUNTER — Other Ambulatory Visit: Payer: Self-pay

## 2019-08-27 ENCOUNTER — Ambulatory Visit (INDEPENDENT_AMBULATORY_CARE_PROVIDER_SITE_OTHER): Payer: Medicare Other | Admitting: Otolaryngology

## 2019-08-27 VITALS — Temp 98.2°F

## 2019-08-27 DIAGNOSIS — H6123 Impacted cerumen, bilateral: Secondary | ICD-10-CM

## 2019-08-27 DIAGNOSIS — Z85818 Personal history of malignant neoplasm of other sites of lip, oral cavity, and pharynx: Secondary | ICD-10-CM

## 2019-08-27 DIAGNOSIS — H903 Sensorineural hearing loss, bilateral: Secondary | ICD-10-CM | POA: Diagnosis not present

## 2019-08-27 NOTE — Progress Notes (Addendum)
HPI: Zachary Lin is a 86 y.o. male who returns today for evaluation of right ear complaints.  He feels like he has some blockage of his right ear as if there is fluid in the ear canal. He is status post right parotidectomy and right neck dissection followed by radiation therapy completed in November 2017. He has had problems some with the right ear hearing as well as congestion in the right ear where he had wax removed from his right ear 8 months ago..  Past Medical History:  Diagnosis Date  . Tinnitus    Past Surgical History:  Procedure Laterality Date  . APPENDECTOMY    . COLONOSCOPY    . DIRECT LARYNGOSCOPY  12/21/2015   Procedure: DIRECT LARYNGOSCOPY;  Surgeon: Rozetta Nunnery, MD;  Location: [facility];  Service: ENT;;  . EYE SURGERY Bilateral    cataract with Lens  . PAROTIDECTOMY Right 12/21/2015   Procedure: RIGHT PAROTIDECTOMY WITH FACIAL NERVE DISSECTION;  Surgeon: Rozetta Nunnery, MD;  Location: [facility];  Service: ENT;  Laterality: Right;  . TONSILLECTOMY     Social History   Socioeconomic History  . Marital status: Single    Spouse name: Not on file  . Number of children: Not on file  . Years of education: Not on file  . Highest education level: Not on file  Occupational History  . Not on file  Tobacco Use  . Smoking status: Former Smoker    Packs/day: 2.00    Years: 45.00    Pack years: 90.00  . Smokeless tobacco: Never Used  . Tobacco comment: 12/20/15- quit 30 years ago  Substance and Sexual Activity  . Alcohol use: Yes    Alcohol/week: 1.0 standard drinks    Types: 1 Cans of beer per week  . Drug use: No  . Sexual activity: Not on file  Other Topics Concern  . Not on file  Social History Narrative   Patient is divorced.   Patient has 4 children.   Live in [place], [place], [place], [place], [place].   Social Determinants of Health   Financial Resource Strain:   . Difficulty of Paying Living Expenses:   Food Insecurity:   . Worried About  Charity fundraiser in the Last Year:   . Arboriculturist in the Last Year:   Transportation Needs:   . Film/video editor (Medical):   Marland Kitchen Lack of Transportation (Non-Medical):   Physical Activity:   . Days of Exercise per Week:   . Minutes of Exercise per Session:   Stress:   . Feeling of Stress :   Social Connections:   . Frequency of Communication with Friends and Family:   . Frequency of Social Gatherings with Friends and Family:   . Attends Religious Services:   . Active Member of Clubs or Organizations:   . Attends Archivist Meetings:   Marland Kitchen Marital Status:    No family history on file. No Known Allergies Prior to Admission medications   Medication Sig Start Date End Date Taking? Authorizing Provider  Capsicum, Cayenne, (CAYENNE PO) Take by mouth.   Yes [provider]  Echinacea 125 MG CAPS Take by mouth daily.   Yes [provider]  saw palmetto 160 MG capsule Take 160 mg by mouth 3 (three) times daily with meals.   Yes [provider]  sodium fluoride (FLUORISHIELD) 1.1 % GEL dental gel Instill one drop of gel per tooth space of fluoride tray. Place  over teeth for 5 minutes. Remove. Spit out excess. Repeat nightly. 01/24/16  Yes Lenn Cal, DDS  tadalafil (CIALIS) 20 MG tablet Take 20 mg by mouth daily as needed for erectile dysfunction.   Yes [provider]  vitamin C (ASCORBIC ACID) 500 MG tablet Take 500 mg by mouth daily.   Yes [provider]     Positive ROS: Otherwise negative  All other systems have been reviewed and were otherwise negative with the exception of those mentioned in the HPI and as above.  Physical Exam: Constitutional: Alert, well-appearing, no acute distress Ears: External ears without lesions or tenderness.  He has small narrowed ear canals bilaterally.  He had minimal wax buildup on the left side with a clear left TM.  Right ear canal revealed dry skin and wax that was removed with  suction forceps and curette.  The TMs were clear bilaterally.  No middle ear fluid noted.  On tuning fork testing he heard slightly better on the left side compared to the right but [facility] was greater than BC bilaterally.  On tuning fork testing he does have mild to moderate SNHL. Nasal: External nose without lesions. Septum is deviated to the right with moderate rhinitis.  No signs of infection. Oral: Lips and gums without lesions. Tongue and palate mucosa without lesions. Posterior oropharynx clear. Neck: No palpable adenopathy or masses.  On palpation of the right parotid and right neck there are no palpable masses or adenopathy noted as well healed incision. Respiratory: Breathing comfortably  Skin: No facial/neck lesions or rash noted.  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  He is status post right parotidectomy neck dissection and radiation therapy. There is no evidence of recurrent disease. Right ear cerumen buildup with probable mild to moderate underlying SNHL  Plan: From the cancer point of view he has a good prognosis with no evidence of recurrent disease. He will follow-up in 8 months for recheck and cleaning of his ears and we will plan on obtaining audiogram on follow-up visit. He has mild to moderate rhinitis with septal deviation to the  right and may be having some eustachian tube dysfunction with the allergies that may be contributing to some of his right ear complaints.  I prescribed Nasacort 2 sprays right nostril daily at night.  [name], MD

## 2019-11-30 ENCOUNTER — Ambulatory Visit (INDEPENDENT_AMBULATORY_CARE_PROVIDER_SITE_OTHER): Payer: Medicare Other | Admitting: Otolaryngology

## 2019-11-30 ENCOUNTER — Other Ambulatory Visit: Payer: Self-pay

## 2019-11-30 DIAGNOSIS — H6521 Chronic serous otitis media, right ear: Secondary | ICD-10-CM | POA: Diagnosis not present

## 2019-11-30 DIAGNOSIS — Z85818 Personal history of malignant neoplasm of other sites of lip, oral cavity, and pharynx: Secondary | ICD-10-CM

## 2019-11-30 NOTE — Progress Notes (Signed)
HPI: Zachary Lin is a 86 y.o. male who returns today for evaluation of high-grade right parotid carcinoma status post surgery in August 2017.  He subsequently received postoperative radiation therapy.  He has had intermittent problems with the right ear since that time with wax buildup as well as decreased hearing.  He has sensation of fluid in the ear.. 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  Past Medical History:  Diagnosis Date   Tinnitus    Past Surgical History:  Procedure Laterality Date   APPENDECTOMY     COLONOSCOPY     DIRECT LARYNGOSCOPY  12/21/2015   Procedure: DIRECT LARYNGOSCOPY;  Surgeon: Rozetta Nunnery, MD;  Location: [facility];  Service: ENT;;   EYE SURGERY Bilateral    cataract with Lens   PAROTIDECTOMY Right 12/21/2015   Procedure: RIGHT PAROTIDECTOMY WITH FACIAL NERVE DISSECTION;  Surgeon: Rozetta Nunnery, MD;  Location: [facility];  Service: ENT;  Laterality: Right;   TONSILLECTOMY     Social History   Socioeconomic History   Marital status: Single    Spouse name: Not on file   Number of children: Not on file   Years of education: Not on file   Highest education level: Not on file  Occupational History   Not on file  Tobacco Use   Smoking status: Former Smoker    Packs/day: 2.00    Years: 45.00    Pack years: 90.00   Smokeless tobacco: Never Used   Tobacco comment: 12/20/15- quit 30 years ago  Substance and Sexual Activity   Alcohol use: Yes    Alcohol/week: 1.0 standard drink    Types: 1 Cans of beer per week   Drug use: No   Sexual activity: Not on file  Other Topics Concern   Not on file  Social History Narrative   Patient is divorced.   Patient has 4 children.   Live in [place], [place], [place], [place], [place].   Social Determinants of Health   Financial Resource Strain:    Difficulty of Paying Living Expenses:   Food Insecurity:    Worried  About Charity fundraiser in the Last Year:    Arboriculturist in the Last Year:   Transportation Needs:    Film/video editor (Medical):    Lack of Transportation (Non-Medical):   Physical Activity:    Days of Exercise per Week:    Minutes of Exercise per Session:   Stress:    Feeling of Stress :   Social Connections:    Frequency of Communication with Friends and Family:    Frequency of Social Gatherings with Friends and Family:    Attends Religious Services:    Active Member of Clubs or Organizations:    Attends Archivist Meetings:    Marital Status:    No family history on file. No Known Allergies Prior to Admission medications   Medication Sig Start Date End Date Taking? Authorizing Provider  Capsicum, Cayenne, (CAYENNE PO) Take by mouth.    [provider]  Echinacea 125 MG CAPS Take by mouth daily.    [provider]  saw palmetto 160 MG capsule Take 160 mg by mouth 3 (three) times daily with meals.    [provider]  sodium fluoride (FLUORISHIELD) 1.1 % GEL dental gel Instill one drop of gel per tooth space of fluoride tray. Place over teeth for 5 minutes.  Remove. Spit out excess. Repeat nightly. 01/24/16   Lenn Cal, DDS  tadalafil (CIALIS) 20 MG tablet Take 20 mg by mouth daily as needed for erectile dysfunction.    [provider]  vitamin C (ASCORBIC ACID) 500 MG tablet Take 500 mg by mouth daily.    [provider]     Positive ROS: Otherwise negative  All other systems have been reviewed and were otherwise negative with the exception of those mentioned in the HPI and as above.  Physical Exam: Constitutional: Alert, well-appearing, no acute distress. Ears: External ears without lesions or tenderness.  He has small ear canals bilaterally but the right one is extremely small with dried wax in the ear canal.  I was able to pass the smallest ear speculum through the external ear canal so I  could visualize the TM.  He has a right serous otitis.  In the office today after discussion with the patient I performed a right M&T with a palpable type I tube.  A serous effusion was aspirated. Nasal: External nose without lesions.  Mild rhinitis with no signs of infection. Clear nasal passages Oral: Lips and gums without lesions. Tongue and palate mucosa without lesions. Posterior oropharynx clear. Neck: No palpable adenopathy or masses.  Patient status post right parotidectomy with no palpable masses or adenopathy on the right side of the neck.  Normal facial nerve function. Respiratory: Breathing comfortably  Skin: No facial/neck lesions or rash noted.  Myringotomy  Date/Time: 11/30/2019 4:55 PM Performed by: Rozetta Nunnery, MD Authorized by: Rozetta Nunnery, MD   Consent:    Consent obtained:  Verbal   Consent given by:  Patient   Risks discussed:  Bleeding and pain   Alternatives discussed:  No treatment Pre-procedure details:    Indications: serous otitis media   Anesthesia:    Anesthesia method:  Topical application   Topical anesthetic:  Phenol Procedure Details:    Location:  Right TM   Hole made in:  Inferior aspect of TM   Tube:  Paparella Type 1 Post-procedure details:    Patient tolerance of procedure:  Tolerated well, no immediate complications Comments:     Right M&T with a palpable type I tube was performed in the office today.  A yellow serous effusion was aspirated.    Assessment: Chronic right serous otitis media. History of parotid cancer status post right parotidectomy and radiation therapy 4 years ago.  On clinical exam today no history of persistent or recurrent cancer.  Plan: He will follow-up in 2 weeks to recheck the right myringotomy tube.    [name], MD

## 2019-12-10 ENCOUNTER — Encounter (INDEPENDENT_AMBULATORY_CARE_PROVIDER_SITE_OTHER): Payer: Self-pay

## 2019-12-10 NOTE — Progress Notes (Unsigned)
Clearance letter for flying.

## 2019-12-14 ENCOUNTER — Other Ambulatory Visit: Payer: Self-pay

## 2019-12-14 ENCOUNTER — Ambulatory Visit (INDEPENDENT_AMBULATORY_CARE_PROVIDER_SITE_OTHER): Payer: Medicare Other | Admitting: Otolaryngology

## 2019-12-14 VITALS — Temp 96.4°F

## 2019-12-14 DIAGNOSIS — Z4889 Encounter for other specified surgical aftercare: Secondary | ICD-10-CM

## 2019-12-14 NOTE — Progress Notes (Signed)
HPI: Zachary Lin is a 86 y.o. male who presents 14 days s/p right M&T.  His hearing is doing better but he has had persistent drainage from the right ear since the tube was placed.  He started eardrops 5 days after he had a tube placed.  He continues to have some drainage not so much during the daytime mostly at night when he wakes up he has some drainage on his pillow..   Past Medical History:  Diagnosis Date  . Tinnitus    Past Surgical History:  Procedure Laterality Date  . APPENDECTOMY    . COLONOSCOPY    . DIRECT LARYNGOSCOPY  12/21/2015   Procedure: DIRECT LARYNGOSCOPY;  Surgeon: Rozetta Nunnery, MD;  Location: [facility];  Service: ENT;;  . EYE SURGERY Bilateral    cataract with Lens  . PAROTIDECTOMY Right 12/21/2015   Procedure: RIGHT PAROTIDECTOMY WITH FACIAL NERVE DISSECTION;  Surgeon: Rozetta Nunnery, MD;  Location: [place] [facility];  Service: ENT;  Laterality: Right;  . TONSILLECTOMY     Social History   Socioeconomic History  . Marital status: Single    Spouse name: Not on file  . Number of children: Not on file  . Years of education: Not on file  . Highest education level: Not on file  Occupational History  . Not on file  Tobacco Use  . Smoking status: Former Smoker    Packs/day: 2.00    Years: 45.00    Pack years: 90.00  . Smokeless tobacco: Never Used  . Tobacco comment: 12/20/15- quit 30 years ago  Substance and Sexual Activity  . Alcohol use: Yes    Alcohol/week: 1.0 standard drink    Types: 1 Cans of beer per week  . Drug use: No  . Sexual activity: Not on file  Other Topics Concern  . Not on file  Social History Narrative   Patient is divorced.   Patient has 4 children.   Live in [place], [place], [place], [place], [place].   Social Determinants of Health   Financial Resource Strain:   . Difficulty of Paying Living Expenses:   Food Insecurity:   . Worried About Charity fundraiser in the Last Year:   . Arboriculturist in the Last Year:    Transportation Needs:   . Film/video editor (Medical):   Marland Kitchen Lack of Transportation (Non-Medical):   Physical Activity:   . Days of Exercise per Week:   . Minutes of Exercise per Session:   Stress:   . Feeling of Stress :   Social Connections:   . Frequency of Communication with Friends and Family:   . Frequency of Social Gatherings with Friends and Family:   . Attends Religious Services:   . Active Member of Clubs or Organizations:   . Attends Archivist Meetings:   Marland Kitchen Marital Status:    No family history on file. No Known Allergies Prior to Admission medications   Medication Sig Start Date End Date Taking? Authorizing Provider  Capsicum, Cayenne, (CAYENNE PO) Take by mouth.   Yes [provider]  Echinacea 125 MG CAPS Take by mouth daily.   Yes [provider]  saw palmetto 160 MG capsule Take 160 mg by mouth 3 (three) times daily with meals.   Yes [provider]  sodium fluoride (FLUORISHIELD) 1.1 % GEL dental gel Instill one drop of gel per tooth space of fluoride tray. Place over teeth for 5 minutes. Remove. Spit out excess. Repeat  nightly. 01/24/16  Yes Lenn Cal, DDS  tadalafil (CIALIS) 20 MG tablet Take 20 mg by mouth daily as needed for erectile dysfunction.   Yes [provider]  vitamin C (ASCORBIC ACID) 500 MG tablet Take 500 mg by mouth daily.   Yes [provider]     Physical Exam: The right ear is slightly sore.  He has a small right ear canal.  There is no active drainage noted from the myringotomy tube presently although he has some moisture around the base of the tube.  After cleaning this with a suction I applied gentian violet, Ciprodex and CSF powder to the right ear.   Assessment: S/p persistent drainage status post placement of right myringotomy tube  Plan: He will continue with Ciprodex eardrops 4 to 5 drops twice daily for the next week. Also placed him on Augmentin 875 mg twice daily  for 10 days and recommended use of Nasacort 2 sprays each nostril at night. He will notify us if he has any persistent drainage beyond 10 days.   [name], MD

## 2019-12-30 ENCOUNTER — Other Ambulatory Visit (INDEPENDENT_AMBULATORY_CARE_PROVIDER_SITE_OTHER): Payer: Self-pay

## 2019-12-30 ENCOUNTER — Telehealth (INDEPENDENT_AMBULATORY_CARE_PROVIDER_SITE_OTHER): Payer: Self-pay

## 2019-12-30 MED ORDER — DOXYCYCLINE HYCLATE 100 MG PO TABS: 100.0000 mg | ORAL_TABLET | Freq: Two times a day (BID) | ORAL | 0 refills | Status: AC

## 2019-12-30 MED ORDER — CIPROFLOXACIN-DEXAMETHASONE 0.3-0.1 % OT SUSP: 4.0000 [drp] | Freq: Two times a day (BID) | OTIC | 1 refills | Status: DC

## 2020-04-11 ENCOUNTER — Encounter (INDEPENDENT_AMBULATORY_CARE_PROVIDER_SITE_OTHER): Payer: Self-pay | Admitting: Otolaryngology

## 2020-04-11 ENCOUNTER — Ambulatory Visit (INDEPENDENT_AMBULATORY_CARE_PROVIDER_SITE_OTHER): Payer: Medicare Other | Admitting: Otolaryngology

## 2020-04-11 ENCOUNTER — Other Ambulatory Visit: Payer: Self-pay

## 2020-04-11 VITALS — Temp 97.9°F

## 2020-04-11 DIAGNOSIS — H6531 Chronic mucoid otitis media, right ear: Secondary | ICD-10-CM

## 2020-04-11 NOTE — Progress Notes (Signed)
HPI: Zaccheaus Storlie is a 86 y.o. male who returns today for evaluation of decreased hearing in his right ear.  Had a Paparella type I tube placed in the right ear little over 4 months ago.  He is always had small ear canals.  But more recently he has been unable to equalize pressure in the right ear with decreased hearing. 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 Tinnitus    Past Surgical History:  Procedure Laterality Date  . APPENDECTOMY    . COLONOSCOPY    . DIRECT LARYNGOSCOPY  12/21/2015   Procedure: DIRECT LARYNGOSCOPY;  Surgeon: Rozetta Nunnery, MD;  Location: [facility];  Service: ENT;;  . EYE SURGERY Bilateral    cataract with Lens  . PAROTIDECTOMY Right 12/21/2015   Procedure: RIGHT PAROTIDECTOMY WITH FACIAL NERVE DISSECTION;  Surgeon: Rozetta Nunnery, MD;  Location: [facility];  Service: ENT;  Laterality: Right;  . TONSILLECTOMY     Social History   Socioeconomic History  . Marital status: Single    Spouse name: Not on file  . Number of children: Not on file  . Years of education: Not on file  . Highest education level: Not on file  Occupational History  . Not on file  Tobacco Use  . Smoking status: Former Smoker    Packs/day: 2.00    Years: 45.00    Pack years: 90.00  . Smokeless tobacco: Never Used  . Tobacco comment: 12/20/15- quit 30 years ago  Substance and Sexual Activity  . Alcohol use: Yes    Alcohol/week: 1.0 standard drink    Types: 1 Cans of beer per week  . Drug use: No  . Sexual activity: Not on file  Other Topics Concern  . Not on file  Social History Narrative   Patient is divorced.   Patient has 4 children.   Live in [place], [place], [place], [place], [place].   Social Determinants of Health   Financial Resource Strain: Not on file  Food Insecurity: Not on file  Transportation Needs: Not on file  Physical Activity: Not on  file  Stress: Not on file  Social Connections: Not on file   No family history on file. No Known Allergies Prior to Admission medications   Medication Sig Start Date End Date Taking? Authorizing Provider  Capsicum, Cayenne, (CAYENNE PO) Take by mouth.    [provider]  ciprofloxacin-dexamethasone (CIPRODEX) OTIC suspension Place 4 drops into both ears 2 (two) times daily. 12/30/19   Rozetta Nunnery, MD  Echinacea 125 MG CAPS Take by mouth daily.    [provider]  saw palmetto 160 MG capsule Take 160 mg by mouth 3 (three) times daily with meals.    [provider]  sodium fluoride (FLUORISHIELD) 1.1 % GEL dental gel Instill one drop of gel per tooth space of fluoride tray. Place over teeth for 5 minutes. Remove. Spit out excess. Repeat nightly. 01/24/16   Lenn Cal, DDS  tadalafil (CIALIS) 20 MG tablet Take 20 mg by mouth daily as needed for erectile dysfunction.    [provider]  vitamin C (ASCORBIC ACID) 500 MG tablet Take 500 mg by mouth daily.    [provider]     Positive ROS: Otherwise negative  All other systems have been reviewed and were otherwise negative with the exception of those mentioned in the HPI  and as above.  Physical Exam: Constitutional: Alert, well-appearing, no acute distress Ears: External ears without lesions or tenderness.  He has small ear canals especially on the right side.  The right Paparella type I tube had extruded lying within the ear canal and this was removed.  The TM is thickened with what appears to be a mucoid otitis media.  A repeat M&T was performed in the office today but used a T-tube following the myringotomy.  He had a large amount of thick mucoid middle ear effusion. Nasal: External nose without lesions. Septum with mild deformity and moderate rhinitis.  Nasal endoscopy was performed through the right nostril in order to evaluate the eustachian tube opening.  The eustachian tube was  unobstructed with a clear nasopharynx.. Oral: Lips and gums without lesions. Tongue and palate mucosa without lesions. Posterior oropharynx clear. Neck: No palpable adenopathy or masses.  There are no palpable masses or adenopathy noted within the right parotid region. Respiratory: Breathing comfortably  Skin: No facial/neck lesions or rash noted.  Myringotomy  Date/Time: 04/11/2020 11:11 AM Performed by: Rozetta Nunnery, MD Authorized by: Rozetta Nunnery, MD   Consent:    Consent obtained:  Verbal   Consent given by:  Patient   Risks discussed:  Bleeding and pain   Alternatives discussed:  No treatment Pre-procedure details:    Indications: mucoid otitis media   Anesthesia:    Anesthesia method:  Topical application   Topical anesthetic:  Phenol Procedure Details:    Location:  Right TM   Hole made in:  Inferior aspect of TM   Tube:  T-tube Post-procedure details:    Patient tolerance of procedure:  Tolerated well, no immediate complications Comments:     Patient with right mucoid otitis media with thick mucoid fluid aspirated from the middle ear space following myringotomy.    Assessment: Chronic right mucoid otitis media status post right M&T with Paparella type I tube 4 months ago. History of parotid cancer status post parotidectomy and radiation therapy  Plan: A T-tube was placed in the right TM today in the office. Gave him some samples of ofloxacin eardrops for 2 days a prescription for ofloxacin eyedrops to use if he has persistent drainage.  He will follow-up in 1 week for recheck. Hearing was improved following placement of the T-tube.   [name], MD

## 2020-04-20 ENCOUNTER — Ambulatory Visit (INDEPENDENT_AMBULATORY_CARE_PROVIDER_SITE_OTHER): Payer: Medicare Other | Admitting: Otolaryngology

## 2020-04-20 ENCOUNTER — Other Ambulatory Visit: Payer: Self-pay

## 2020-04-20 VITALS — Temp 97.2°F

## 2020-04-20 DIAGNOSIS — Z4889 Encounter for other specified surgical aftercare: Secondary | ICD-10-CM

## 2020-04-20 NOTE — Progress Notes (Signed)
HPI: Zachary Lin is a 86 y.o. male who presents 10 days s/p right M&T with a T-tube.  He has been having some drainage from the ear and has been using antibiotic eardrops although he is having difficulty getting the eardrops in the ear..   Past Medical History:  Diagnosis Date  . Tinnitus    Past Surgical History:  Procedure Laterality Date  . APPENDECTOMY    . COLONOSCOPY    . DIRECT LARYNGOSCOPY  12/21/2015   Procedure: DIRECT LARYNGOSCOPY;  Surgeon: Rozetta Nunnery, MD;  Location: [facility];  Service: ENT;;  . EYE SURGERY Bilateral    cataract with Lens  . PAROTIDECTOMY Right 12/21/2015   Procedure: RIGHT PAROTIDECTOMY WITH FACIAL NERVE DISSECTION;  Surgeon: Rozetta Nunnery, MD;  Location: [facility];  Service: ENT;  Laterality: Right;  . TONSILLECTOMY     Social History   Socioeconomic History  . Marital status: Single    Spouse name: Not on file  . Number of children: Not on file  . Years of education: Not on file  . Highest education level: Not on file  Occupational History  . Not on file  Tobacco Use  . Smoking status: Former Smoker    Packs/day: 2.00    Years: 45.00    Pack years: 90.00  . Smokeless tobacco: Never Used  . Tobacco comment: 12/20/15- quit 30 years ago  Substance and Sexual Activity  . Alcohol use: Yes    Alcohol/week: 1.0 standard drink    Types: 1 Cans of beer per week  . Drug use: No  . Sexual activity: Not on file  Other Topics Concern  . Not on file  Social History Narrative   Patient is divorced.   Patient has 4 children.   Live in [place], [place], [place], [place], [place].   Social Determinants of Health   Financial Resource Strain: Not on file  Food Insecurity: Not on file  Transportation Needs: Not on file  Physical Activity: Not on file  Stress: Not on file  Social Connections: Not on file   No family history on file. No Known Allergies Prior to Admission medications   Medication Sig Start Date End Date Taking?  Authorizing Provider  Capsicum, Cayenne, (CAYENNE PO) Take by mouth.    [provider]  ciprofloxacin-dexamethasone (CIPRODEX) OTIC suspension Place 4 drops into both ears 2 (two) times daily. 12/30/19   Rozetta Nunnery, MD  Echinacea 125 MG CAPS Take by mouth daily.    [provider]  saw palmetto 160 MG capsule Take 160 mg by mouth 3 (three) times daily with meals.    [provider]  sodium fluoride (FLUORISHIELD) 1.1 % GEL dental gel Instill one drop of gel per tooth space of fluoride tray. Place over teeth for 5 minutes. Remove. Spit out excess. Repeat nightly. 01/24/16   Lenn Cal, DDS  tadalafil (CIALIS) 20 MG tablet Take 20 mg by mouth daily as needed for erectile dysfunction.    [provider]  vitamin C (ASCORBIC ACID) 500 MG tablet Take 500 mg by mouth daily.    [provider]     Physical Exam: On microscopic exam patient has slightly swollen but a very small right ear canal.  This was cleaned with suction.  After cleaning the ear canal patient has a inferior TM perforation but cannot obviously see the myringotomy tube as I suspect this might of fallen out.  He is having minimal drainage today.   Assessment:  S/p after cleaning the right ear canal I applied Ciprodex eardrops into the right middle ear space. Refilled his Ciprodex eardrops as well as antibiotics Ceftin 500 mg twice daily for 1 week if he develops any further drainage starting tomorrow. He will follow-up in 4 months for recheck.  He will return earlier if he notices any persistent drainage from the ear or persistent hearing loss in the right ear.  Plan: He will follow-up if he has any persistent drainage from the ear or notices any worsening of his hearing.  Otherwise we will follow up in 4 months for recheck.   Narda Bonds, MD

## 2020-06-20 ENCOUNTER — Telehealth (INDEPENDENT_AMBULATORY_CARE_PROVIDER_SITE_OTHER): Payer: Self-pay | Admitting: Otolaryngology

## 2020-06-20 NOTE — Telephone Encounter (Signed)
Zachary Lin called this morning concerning a bout of dizziness that he had this morning when he initially got out of bed.  This gradually cleared up and he was able to drive to a dental appointment but had a similar episode of dizziness when they laid him back in the chair.  He was able to drive home and called [place] concerning any further treatment. Discussed with him that this probably represents BPPV and suggested looking up on the Internet concerning the Epley maneuver and trying this.  It most likely will gradually improve with time even without performing the maneuver.  Did not recommend any specific medications but recommended follow-up here if symptoms do not resolve over the next week.

## 2020-07-27 ENCOUNTER — Ambulatory Visit (INDEPENDENT_AMBULATORY_CARE_PROVIDER_SITE_OTHER): Payer: Medicare Other | Admitting: Otolaryngology

## 2020-07-27 ENCOUNTER — Other Ambulatory Visit: Payer: Self-pay

## 2020-07-27 DIAGNOSIS — H9071 Mixed conductive and sensorineural hearing loss, unilateral, right ear, with unrestricted hearing on the contralateral side: Secondary | ICD-10-CM

## 2020-07-27 DIAGNOSIS — H7291 Unspecified perforation of tympanic membrane, right ear: Secondary | ICD-10-CM

## 2020-07-27 DIAGNOSIS — H6981 Other specified disorders of Eustachian tube, right ear: Secondary | ICD-10-CM

## 2020-07-27 NOTE — Progress Notes (Signed)
HPI: Zachary Lin is a 87 y.o. male who returns today for evaluation of right ear.  Patient has had a previous history of a right parotid gland poorly differentiated carcinoma status post surgical excision performed in 2017.  He received postoperative radiation therapy.  He has had no evidence of recurrent disease however he has since had chronic problems with his hearing in the right ear as well as middle ear effusion and eustachian tube dysfunction on the right side.  He had a T-tube placed about 8 months ago and he had problems with chronic drainage.  The tube subsequently extruded he has had intermittent drainage from the right TM perforation.  His hearing in his right ear is not as good and he feels like this fluctuates throughout the day.  He has occasional drainage at night from his ear but no drainage during the daytime..  Past Medical History:  Diagnosis Date  . Tinnitus    Past Surgical History:  Procedure Laterality Date  . APPENDECTOMY    . COLONOSCOPY    . DIRECT LARYNGOSCOPY  12/21/2015   Procedure: DIRECT LARYNGOSCOPY;  Surgeon: Rozetta Nunnery, MD;  Location: [facility];  Service: ENT;;  . EYE SURGERY Bilateral    cataract with Lens  . PAROTIDECTOMY Right 12/21/2015   Procedure: RIGHT PAROTIDECTOMY WITH FACIAL NERVE DISSECTION;  Surgeon: Rozetta Nunnery, MD;  Location: [facility];  Service: ENT;  Laterality: Right;  . TONSILLECTOMY     Social History   Socioeconomic History  . Marital status: Single    Spouse name: Not on file  . Number of children: Not on file  . Years of education: Not on file  . Highest education level: Not on file  Occupational History  . Not on file  Tobacco Use  . Smoking status: Former Smoker    Packs/day: 2.00    Years: 45.00    Pack years: 90.00  . Smokeless tobacco: Never Used  . Tobacco comment: 12/20/15- quit 30 years ago  Substance and Sexual Activity  . Alcohol use: Yes    Alcohol/week: 1.0 standard drink    Types: 1 Cans of beer  per week  . Drug use: No  . Sexual activity: Not on file  Other Topics Concern  . Not on file  Social History Narrative   Patient is divorced.   Patient has 4 children.   Live in [place], [place], [place], [place], [place].   Social Determinants of Health   Financial Resource Strain: Not on file  Food Insecurity: Not on file  Transportation Needs: Not on file  Physical Activity: Not on file  Stress: Not on file  Social Connections: Not on file   No family history on file. No Known Allergies Prior to Admission medications   Medication Sig Start Date End Date Taking? Authorizing Provider  Capsicum, Cayenne, (CAYENNE PO) Take by mouth.    [provider]  ciprofloxacin-dexamethasone (CIPRODEX) OTIC suspension Place 4 drops into both ears 2 (two) times daily. 12/30/19   Rozetta Nunnery, MD  Echinacea 125 MG CAPS Take by mouth daily.    [provider]  saw palmetto 160 MG capsule Take 160 mg by mouth 3 (three) times daily with meals.    [provider]  sodium fluoride (FLUORISHIELD) 1.1 % GEL dental gel Instill one drop of gel per tooth space of fluoride tray. Place over teeth for 5 minutes. Remove. Spit out excess. Repeat nightly. 01/24/16   Lenn Cal, DDS  tadalafil (  CIALIS) 20 MG tablet Take 20 mg by mouth daily as needed for erectile dysfunction.    [provider]  vitamin C (ASCORBIC ACID) 500 MG tablet Take 500 mg by mouth daily.    [provider]     Positive ROS: Otherwise negative  All other systems have been reviewed and were otherwise negative with the exception of those mentioned in the HPI and as above.  Physical Exam: Constitutional: Alert, well-appearing, no acute distress Ears: External ears without lesions or tenderness.  Right ear examination reveals a very small right ear canal.  He has a inferior right TM perforation with no active drainage noted presently.  I cleaned the ear canal with a #5 suction and  then applied CSF powder to the right ear. Nasal: External nose without lesions. Septum with mild deformity.. Clear nasal passages otherwise. Oral: Lips and gums without lesions. Tongue and palate mucosa without lesions. Posterior oropharynx clear. Neck: No palpable adenopathy or masses.  Palpation of the right parotid bed reveals no palpable masses.  He has normal facial nerve function.  No adenopathy in the lower neck. Respiratory: Breathing comfortably  Skin: No facial/neck lesions or rash noted.  Procedures  Assessment: Chronic right eustachian tube dysfunction with hearing loss. Right TM perforation History of parotid cancer on the right status post surgery and postoperative radiation therapy in 2017.  Plan: Refilled his Ciprodex in the office today as this seems to help when he has drainage from the right ear. Also suggested use of nasal steroid spray which he is used in the past for eustachian tube function. He probably needs a hearing aid for the right ear but might wait until he has no drainage from the ear before obtaining a hearing aid in the right ear. He will follow-up in 3 to 4 months for recheck.   [name], MD

## 2020-11-09 ENCOUNTER — Ambulatory Visit (INDEPENDENT_AMBULATORY_CARE_PROVIDER_SITE_OTHER): Payer: Medicare Other | Admitting: Otolaryngology

## 2020-11-09 ENCOUNTER — Other Ambulatory Visit: Payer: Self-pay

## 2020-11-09 DIAGNOSIS — Z85818 Personal history of malignant neoplasm of other sites of lip, oral cavity, and pharynx: Secondary | ICD-10-CM | POA: Diagnosis not present

## 2020-11-09 DIAGNOSIS — H903 Sensorineural hearing loss, bilateral: Secondary | ICD-10-CM | POA: Diagnosis not present

## 2020-11-09 DIAGNOSIS — H7291 Unspecified perforation of tympanic membrane, right ear: Secondary | ICD-10-CM | POA: Diagnosis not present

## 2020-11-09 DIAGNOSIS — H6121 Impacted cerumen, right ear: Secondary | ICD-10-CM | POA: Diagnosis not present

## 2020-11-09 NOTE — Progress Notes (Addendum)
HPI: Zachary Lin is a 87 y.o. male who returns today for evaluation of history of a high-grade right parotid cancer status post excision of this in 2017 with postoperative radiation therapy.  He has done well with no specific complaints.  He does complain of decreased hearing especially when there is background noise.  His hearing in his right ear is slightly worse than his left ear.  He is trying to get hearing aids through the [place] system.  He has had occasional drainage from the right ear and has eardrops to use if he has any drainage but has not had any drainage recently..  Past Medical History:  Diagnosis Date  . Tinnitus    Past Surgical History:  Procedure Laterality Date  . APPENDECTOMY    . COLONOSCOPY    . DIRECT LARYNGOSCOPY  12/21/2015   Procedure: DIRECT LARYNGOSCOPY;  Surgeon: Rozetta Nunnery, MD;  Location: [facility];  Service: ENT;;  . EYE SURGERY Bilateral    cataract with Lens  . PAROTIDECTOMY Right 12/21/2015   Procedure: RIGHT PAROTIDECTOMY WITH FACIAL NERVE DISSECTION;  Surgeon: Rozetta Nunnery, MD;  Location: [facility];  Service: ENT;  Laterality: Right;  . TONSILLECTOMY     Social History   Socioeconomic History  . Marital status: Single    Spouse name: Not on file  . Number of children: Not on file  . Years of education: Not on file  . Highest education level: Not on file  Occupational History  . Not on file  Tobacco Use  . Smoking status: Former    Packs/day: 2.00    Years: 45.00    Pack years: 90.00    Types: Cigarettes  . Smokeless tobacco: Never  . Tobacco comments:    12/20/15- quit 30 years ago  Substance and Sexual Activity  . Alcohol use: Yes    Alcohol/week: 1.0 standard drink    Types: 1 Cans of beer per week  . Drug use: No  . Sexual activity: Not on file  Other Topics Concern  . Not on file  Social History Narrative   Patient is divorced.   Patient has 4 children.   Live in [place], [place], [place], [place], [place].   Social  Determinants of Health   Financial Resource Strain: Not on file  Food Insecurity: Not on file  Transportation Needs: Not on file  Physical Activity: Not on file  Stress: Not on file  Social Connections: Not on file   No family history on file. No Known Allergies Prior to Admission medications   Medication Sig Start Date End Date Taking? Authorizing Provider  Capsicum, Cayenne, (CAYENNE PO) Take by mouth.    [provider]  ciprofloxacin-dexamethasone (CIPRODEX) OTIC suspension Place 4 drops into both ears 2 (two) times daily. 12/30/19   Rozetta Nunnery, MD  Echinacea 125 MG CAPS Take by mouth daily.    [provider]  saw palmetto 160 MG capsule Take 160 mg by mouth 3 (three) times daily with meals.    [provider]  sodium fluoride (FLUORISHIELD) 1.1 % GEL dental gel Instill one drop of gel per tooth space of fluoride tray. Place over teeth for 5 minutes. Remove. Spit out excess. Repeat nightly. 01/24/16   Lenn Cal, DDS  tadalafil (CIALIS) 20 MG tablet Take 20 mg by mouth daily as needed for erectile dysfunction.    [provider]  vitamin C (ASCORBIC ACID) 500 MG tablet Take 500 mg by mouth daily.  [provider]     Positive ROS: Otherwise negative  All other systems have been reviewed and were otherwise negative with the exception of those mentioned in the HPI and as above.  Physical Exam: Constitutional: Alert, well-appearing, no acute distress Ears: External ears without lesions or tenderness.  Left ear canal and left TM are clear.  Right ear canal is small.  Has small amount of debris in the right ear canal that was cleaned with suction.  Of note he has a small right inferior TM perforation which is dry with no drainage.  On hearing screening with a tuning forks he hears better in the left ear compared to the right with mild to moderate hearing loss in both ears.  AC > BC bilaterally. Nasal: External nose without  lesions. Septum with mild deformity and mild rhinitis. Clear nasal passages otherwise. Oral: Lips and gums without lesions. Tongue and palate mucosa without lesions. Posterior oropharynx clear. Neck: No palpable adenopathy or masses.  There is no palpable adenopathy in the neck.  Parotid bed on the right side is benign to palpation with no nodules.  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  No palpable adenopathy on either side of the neck. Respiratory: Breathing comfortably  Skin: No facial/neck lesions or rash noted.  Cerumen impaction removal  Date/Time: 11/09/2020 8:55 AM Performed by: Rozetta Nunnery, MD Authorized by: Rozetta Nunnery, MD   Consent:    Consent obtained:  Verbal   Consent given by:  Patient   Risks discussed:  Pain and bleeding Procedure details:    Location:  R ear   Procedure type: suction   Post-procedure details:    Inspection:  TM intact and canal normal   Hearing quality:  Improved   Procedure completion:  Tolerated well, no immediate complications Comments:     Right ear canal is small and he has a small amount of debris within the right ear canal adjacent to the TM that was cleaned with suction.  Of note he has a small inferior right TM perforation which is dry with no evidence of infection.  Assessment: 5 years status post right parotidectomy and radiation therapy to treat high-grade right parotid carcinoma.  No evidence of recurrent disease and this is essentially considered cured. Bilateral sensorineural hearing loss. 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  Plan: Would recommend obtaining hearing aids as he has some difficulty with hearing at times especially when there is background noise.  He is trying to get these through the [place] system. Reassured him of no evidence of recurrent cancer and that this essentially considered cured. Also discussed with him  concerning small right TM perforation and should keep water out of the right ear. He will follow-up as needed.   [name], MD

## 2021-06-14 ENCOUNTER — Other Ambulatory Visit: Payer: Self-pay | Admitting: Oncology

## 2021-06-14 DIAGNOSIS — C07 Malignant neoplasm of parotid gland: Secondary | ICD-10-CM

## 2021-06-14 NOTE — Progress Notes (Signed)
[facility]  [address] [place],  [place]  [address] [phone]  Clinic Day:  06/15/2021  Referring physician: No ref. provider found  This document serves as a record of services personally performed by Hosie Poisson, MD. It was created on their behalf by Curry,Lauren E, a trained medical scribe. The creation of this record is based on the scribe's personal observations and the provider's statements to them.  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 This was treated with excision and radiation therapy. He has remained without evidence of recurrence for the past 5 and 1/2 years. With the history of radiation to the neck, I will check a thyroid function.  Anemia, macrocytic and mild. We will evaluate with B12 and folate.  Chronic tinnitus.  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 I consulted with Dr. Orlene Erm and there really is no treatment for this that he is aware of.   This is a pleasant 88 year old male who presents as a transfer of care for the continued management of his history of squamous cell carcinoma of the parotid gland. He continues to do fairly well and has remained without evidence of recurrence for over 5 and 1/2 years. We will continue to monitor him routinely, and so will plan to see him back in 1 year with CBC and CMP for repeat evaluation. As above, we will check B12 and folate for further evaluation of his anemia. With the history of radiation to the neck, I will check a thyroid function. He understands and agrees with this plan of care. I have answered his questions and he knows to call with any concerns.  Thank you for the opportunity to participate in the care of your patients  I provided 50 minutes of face-to-face time during this this encounter and > 50% was spent counseling as documented under my  assessment and plan.    Derwood Kaplan, MD [facility] [address] [place] [address] Dept: [phone] Dept Fax: [phone]    CHIEF COMPLAINT:  CC: History of parotid cancer  Current Treatment:  Surveillance   HISTORY OF PRESENT ILLNESS:  Zachary Lin is a 88 y.o. male who presents as a transfer of care from Dr. Pollie Friar office for the continued evaluation and management due to his history of parotid cancer. This began when he developed a mass of the right neck after a tooth extraction. Initially, it was felt to be secondary to infection, however, it persisted for a year and did not respond to antibiotics. He was referred to Dr. 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 This was treated with excision and radiation therapy, 6600 cGy in 33 fractions. Restaging PET from May 2018 revealed no residual metabolic activity associated with the right ear or soft tissues deep in the right ureter. No metabolic adenopathy in the neck, and no evidence of distant metastatic disease. He has remained without evidence of recurrent disease for over 5 and 1/2 years.   INTERVAL HISTORY:  I have reviewed his chart and materials related to his cancer extensively and collaborated history with the patient. Summary of oncologic history is as follows: Oncology History   No history exists.    Zachary Lin states that he has been well. He  has been having yellow wax drainage from the right ear, which he states has become chronic since his radiation treatment. This is his main complaint. He is hard of hearing, and recently started using a hearing aid of the left ear. He is unable to use one in the right ear due to the narrowing of the ear canal since radiation. He has had chronic tinnitus of the ears for the past 50 years. He does drink one beer daily. Hemoglobin is 12.9  with an MCV of 95, and white count and platelets are normal. Chemistries are unremarkable except for a BUN of 27. His  appetite is good, and he is eating well. He denies any significant unintentional weight loss or gain.  He denies fever, chills or other signs of infection.  He denies nausea, vomiting, bowel issues, or abdominal pain.  He denies sore throat, cough, dyspnea, or chest pain.  HISTORY:   Past Medical History:  Diagnosis Date   Cancer [facility])    Skin cancer    right ear   Tinnitus     Past Surgical History:  Procedure Laterality Date   APPENDECTOMY     COLONOSCOPY     DIRECT LARYNGOSCOPY  12/21/2015   Procedure: DIRECT LARYNGOSCOPY;  Surgeon: Rozetta Nunnery, MD;  Location: [facility];  Service: ENT;;   EYE SURGERY Bilateral    cataract with Lens   PAROTIDECTOMY Right 12/21/2015   Procedure: RIGHT PAROTIDECTOMY WITH FACIAL NERVE DISSECTION;  Surgeon: Rozetta Nunnery, MD;  Location: [facility];  Service: ENT;  Laterality: Right;   TONSILLECTOMY      History reviewed. No pertinent family history.  Social History:  reports that he quit smoking about 36 years ago. His smoking use included cigarettes. He has a 90.00 pack-year smoking history. He has never used smokeless tobacco. He reports current alcohol use of about 1.0 standard drink per week. He reports that he does not use drugs.The patient is alone today. He is divorced and lives at home alone. He has 4 children. 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 He has been exposed to chemicals or other toxic agents in [place].  Allergies: No Known Allergies  Current Medications: Current Outpatient Medications  Medication Sig Dispense Refill   PRESCRIPTION MEDICATION Take crestor unsure of mg dosage     Capsicum, Cayenne, (CAYENNE PO) Take by mouth.     Echinacea 125 MG CAPS Take by mouth daily.     saw palmetto 160 MG capsule Take 160 mg by mouth 3 (three) times daily with  meals.     vitamin C (ASCORBIC ACID) 500 MG tablet Take 500 mg by mouth daily.     No current facility-administered medications for this visit.    REVIEW OF SYSTEMS:  Review of Systems  Constitutional: Negative.  Negative for appetite change, chills, fatigue, fever and unexpected weight change.  HENT:   Positive for hearing loss (mainly the right ear, has hearing aid on the left) and tinnitus.   Eyes: Negative.   Respiratory: Negative.  Negative for chest tightness, cough, hemoptysis, shortness of breath and wheezing.   Cardiovascular: Negative.  Negative for chest pain, leg swelling and palpitations.  Gastrointestinal: Negative.  Negative for abdominal distention, abdominal pain, blood in stool, constipation, diarrhea, nausea and vomiting.  Endocrine: Negative.   Genitourinary: Negative.  Negative for difficulty urinating, dysuria, frequency and hematuria.   Musculoskeletal: Negative.  Negative for arthralgias, back  pain, flank pain, gait problem and myalgias.  Skin: Negative.   Neurological: Negative.  Negative for dizziness, extremity weakness, gait problem, headaches, light-headedness, numbness, seizures and speech difficulty.  Hematological: Negative.   Psychiatric/Behavioral: Negative.  Negative for depression and sleep disturbance. The patient is not nervous/anxious.   All other systems reviewed and are negative.    VITALS:  Blood pressure (!) 170/80, pulse 63, temperature 97.8 F (36.6 C), temperature source Oral, resp. rate 18, height 5\' 7"  (1.702 m), weight 155 lb 9.6 oz (70.6 kg), SpO2 99 %.  Wt Readings from Last 3 Encounters:  06/15/21 155 lb 9.6 oz (70.6 kg)  01/13/16 154 lb 8 oz (70.1 kg)  12/21/15 155 lb 3.3 oz (70.4 kg)    Body mass index is 24.37 kg/m.  Performance status (ECOG): 1 - Symptomatic but completely ambulatory  PHYSICAL EXAM:  Physical Exam Constitutional:      General: He is not in acute distress.    Appearance: Normal appearance. He is normal  weight.  HENT:     Head: Normocephalic and atraumatic.     Ears:     Comments: Ear canal is very narrowed but clean, no purulent material or signs of inflammation. Scar of the superior pinna of the right ear.  Eyes:     General: No scleral icterus.    Extraocular Movements: Extraocular movements intact.     Conjunctiva/sclera: Conjunctivae normal.     Pupils: Pupils are equal, round, and reactive to light.  Neck:     Comments: Prominent tendon of the right neck and barely visible scar of the infraauricular area which is well healed. No nodes are palpable. Cardiovascular:     Rate and Rhythm: Normal rate and regular rhythm.     Pulses: Normal pulses.     Heart sounds: Normal heart sounds. No murmur heard.   No friction rub. No gallop.  Pulmonary:     Effort: Pulmonary effort is normal. No respiratory distress.     Breath sounds: Normal breath sounds.  Abdominal:     General: Bowel sounds are normal. There is no distension.     Palpations: Abdomen is soft. There is no hepatomegaly, splenomegaly or mass.     Tenderness: There is no abdominal tenderness.  Musculoskeletal:        General: Normal range of motion.     Cervical back: Normal range of motion and neck supple.     Right lower leg: Edema (trace) present.     Left lower leg: Edema (trace, worse than left) present.  Lymphadenopathy:     Cervical: No cervical adenopathy.  Skin:    General: Skin is warm and dry.  Neurological:     General: No focal deficit present.     Mental Status: He is alert and oriented to person, place, and time. Mental status is at baseline.  Psychiatric:        Mood and Affect: Mood normal.        Behavior: Behavior normal.        Thought Content: Thought content normal.        Judgment: Judgment normal.     LABS:   CBC Latest Ref Rng & Units 06/15/2021 12/21/2015  WBC - 4.0 6.0  Hemoglobin 13.5 - 17.5 12.9(A) 14.5  Hematocrit 41 - 53 38(A) 42.9  Platelets 150 - 399 158 162   CMP Latest Ref  Rng & Units 06/15/2021 12/21/2015  Glucose 65 - 99 mg/dL - 99  BUN 4 - 21  27(A) 23(H)  Creatinine 0.6 - 1.3 0.8 0.88  Sodium 137 - 147 139 138  Potassium 3.4 - 5.3 4.7 4.2  Chloride 99 - 108 108 108  CO2 13 - 22 25(A) 22  Calcium 8.7 - 10.7 8.7 8.9  Alkaline Phos 25 - 125 80 -  AST 14 - 40 26 -  ALT 10 - 40 20 -     STUDIES:  No results found.      I, Rita Ohara, am acting as scribe for Derwood Kaplan, MD  I have reviewed this report as typed by the medical scribe, and it is complete and accurate.

## 2021-06-15 ENCOUNTER — Other Ambulatory Visit: Payer: Self-pay | Admitting: Oncology

## 2021-06-15 ENCOUNTER — Inpatient Hospital Stay: Payer: Medicare Other

## 2021-06-15 ENCOUNTER — Inpatient Hospital Stay: Payer: Medicare Other | Attending: Oncology | Admitting: Oncology

## 2021-06-15 ENCOUNTER — Encounter: Payer: Self-pay | Admitting: Oncology

## 2021-06-15 ENCOUNTER — Other Ambulatory Visit: Payer: Self-pay

## 2021-06-15 VITALS — BP 170/80 | HR 63 | Temp 97.8°F | Resp 18 | Ht 67.0 in | Wt 155.6 lb

## 2021-06-15 DIAGNOSIS — D539 Nutritional anemia, unspecified: Secondary | ICD-10-CM

## 2021-06-15 DIAGNOSIS — D649 Anemia, unspecified: Secondary | ICD-10-CM | POA: Diagnosis not present

## 2021-06-15 DIAGNOSIS — Z79899 Other long term (current) drug therapy: Secondary | ICD-10-CM | POA: Diagnosis not present

## 2021-06-15 DIAGNOSIS — D531 Other megaloblastic anemias, not elsewhere classified: Secondary | ICD-10-CM

## 2021-06-15 DIAGNOSIS — Z87891 Personal history of nicotine dependence: Secondary | ICD-10-CM | POA: Diagnosis not present

## 2021-06-15 DIAGNOSIS — H9319 Tinnitus, unspecified ear: Secondary | ICD-10-CM | POA: Diagnosis not present

## 2021-06-15 DIAGNOSIS — Z85828 Personal history of other malignant neoplasm of skin: Secondary | ICD-10-CM | POA: Insufficient documentation

## 2021-06-15 DIAGNOSIS — C07 Malignant neoplasm of parotid gland: Secondary | ICD-10-CM

## 2021-06-15 DIAGNOSIS — Z923 Personal history of irradiation: Secondary | ICD-10-CM | POA: Insufficient documentation

## 2021-06-15 DIAGNOSIS — D519 Vitamin B12 deficiency anemia, unspecified: Secondary | ICD-10-CM | POA: Diagnosis not present

## 2021-06-15 DIAGNOSIS — Z85818 Personal history of malignant neoplasm of other sites of lip, oral cavity, and pharynx: Secondary | ICD-10-CM | POA: Insufficient documentation

## 2021-06-15 LAB — BASIC METABOLIC PANEL
BUN: 27 — AB (ref 4–21)
CO2: 25 — AB (ref 13–22)
Chloride: 108 (ref 99–108)
Creatinine: 0.8 (ref 0.6–1.3)
Glucose: 78
Potassium: 4.7 (ref 3.4–5.3)
Sodium: 139 (ref 137–147)

## 2021-06-15 LAB — HEPATIC FUNCTION PANEL
ALT: 20 (ref 10–40)
AST: 26 (ref 14–40)
Alkaline Phosphatase: 80 (ref 25–125)
Bilirubin, Total: 0.6

## 2021-06-15 LAB — CBC AND DIFFERENTIAL
HCT: 38 — AB (ref 41–53)
Hemoglobin: 12.9 — AB (ref 13.5–17.5)
Neutrophils Absolute: 2.48
Platelets: 158 (ref 150–399)
WBC: 4

## 2021-06-15 LAB — CBC: RBC: 4.01 (ref 3.87–5.11)

## 2021-06-15 LAB — COMPREHENSIVE METABOLIC PANEL
Albumin: 3.7 (ref 3.5–5.0)
Calcium: 8.7 (ref 8.7–10.7)

## 2021-06-15 LAB — TSH: TSH: 1.543 u[IU]/mL (ref 0.350–4.500)

## 2021-06-15 LAB — VITAMIN B12: Vitamin B-12: 170 pg/mL — ABNORMAL LOW (ref 180–914)

## 2021-06-15 LAB — FOLATE: Folate: 13 ng/mL (ref >5.9)

## 2021-06-22 DIAGNOSIS — D519 Vitamin B12 deficiency anemia, unspecified: Secondary | ICD-10-CM | POA: Insufficient documentation

## 2021-06-23 ENCOUNTER — Telehealth: Payer: Self-pay

## 2021-06-23 NOTE — Telephone Encounter (Signed)
-----   Message from Derwood Kaplan, MD sent at 06/22/2021  6:34 PM EST ----- Regarding: call Tell him thyroid is good but his B12 level is quite low.  I rec injections, weekly x 4, then monthly.  We can arrange here if he would like.  If prefers to try pills first, would need to return in 3 months to recheck level as some people cannot absorb B12 from their stomach (pernicious anemia)

## 2021-07-03 NOTE — Telephone Encounter (Signed)
-----   Message from Derwood Kaplan, MD sent at 06/22/2021  6:34 PM EST ----- ?Regarding: call ?Tell him thyroid is good but his B12 level is quite low.  I rec injections, weekly x 4, then monthly.  We can arrange here if he would like.  If prefers to try pills first, would need to return in 3 months to recheck level as some people cannot absorb B12 from their stomach (pernicious anemia) ? ?

## 2021-07-03 NOTE — Telephone Encounter (Signed)
Attempted multiple times to contact patient. No answer. Unsure what the patient would like to do. ?

## 2021-07-05 ENCOUNTER — Other Ambulatory Visit: Payer: Self-pay | Admitting: Oncology

## 2021-07-05 DIAGNOSIS — D519 Vitamin B12 deficiency anemia, unspecified: Secondary | ICD-10-CM

## 2021-07-05 NOTE — Telephone Encounter (Signed)
Patient returned call. Patient wants to try to take oral B12 for convenience and is will to come back in 3 months for recheck.  ?

## 2021-10-06 ENCOUNTER — Other Ambulatory Visit: Payer: Medicare Other

## 2021-10-10 ENCOUNTER — Other Ambulatory Visit: Payer: Self-pay

## 2021-10-10 ENCOUNTER — Inpatient Hospital Stay: Payer: Medicare Other | Attending: Oncology

## 2021-10-10 DIAGNOSIS — C07 Malignant neoplasm of parotid gland: Secondary | ICD-10-CM | POA: Insufficient documentation

## 2021-10-10 DIAGNOSIS — D519 Vitamin B12 deficiency anemia, unspecified: Secondary | ICD-10-CM | POA: Diagnosis not present

## 2021-10-10 DIAGNOSIS — D649 Anemia, unspecified: Secondary | ICD-10-CM | POA: Insufficient documentation

## 2021-10-10 LAB — VITAMIN B12: Vitamin B-12: 1702 pg/mL — ABNORMAL HIGH (ref 180–914)

## 2021-10-10 LAB — CBC AND DIFFERENTIAL
HCT: 39 — AB (ref 41–53)
Hemoglobin: 12.8 — AB (ref 13.5–17.5)
Neutrophils Absolute: 2.17
Platelets: 143 10*3/uL — AB (ref 150–400)
WBC: 3.8

## 2021-10-10 LAB — CBC: RBC: 4.16 (ref 3.87–5.11)

## 2021-12-01 ENCOUNTER — Other Ambulatory Visit: Payer: Self-pay | Admitting: Hematology and Oncology

## 2021-12-05 ENCOUNTER — Telehealth: Payer: Self-pay

## 2021-12-05 NOTE — Telephone Encounter (Signed)
-----   Message from Derwood Kaplan, MD sent at 11/25/2021  4:46 PM EDT ----- Regarding: call Tell him still mild anemia last month but B12 very high, can stop it

## 2022-06-07 ENCOUNTER — Other Ambulatory Visit: Payer: Self-pay | Admitting: Hematology and Oncology

## 2022-06-07 DIAGNOSIS — D519 Vitamin B12 deficiency anemia, unspecified: Secondary | ICD-10-CM

## 2022-06-07 DIAGNOSIS — T66XXXD Radiation sickness, unspecified, subsequent encounter: Secondary | ICD-10-CM

## 2022-06-07 DIAGNOSIS — C07 Malignant neoplasm of parotid gland: Secondary | ICD-10-CM

## 2022-06-07 DIAGNOSIS — Z923 Personal history of irradiation: Secondary | ICD-10-CM

## 2022-06-13 NOTE — Progress Notes (Deleted)
[facility]  [address] [place],  [place]  [address] [phone]  Clinic Day:  06/13/2022  Referring physician: No ref. provider found   CHIEF COMPLAINT:  CC: ***  Current Treatment:  ***  HISTORY OF PRESENT ILLNESS:  Zachary Lin is a 89 y.o. 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  He was treated with right parotidectomy with facial nerve dissection in August 2017.  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  Further surgery was not recommended due to the risk of paralysis of the facial nerve.  He received postoperative radiation to the right parotid bed and regional lymph nodes.  He received 66 cGy in 33 fractions completed in in November 2017.  We began seeing him in February 2023 to transfer his care locally.  He was found to have B12 deficiency, with a B12 of 170.  He was placed on oral B12 at his request.  Repeat B12 in June was over thousand, so B12 was placed on hold.   Oncology History  Cancer of parotid gland ([place])  12/21/2015 Initial Diagnosis   Cancer of parotid gland ([place])   12/21/2015 Cancer Staging   Staging form: Major Salivary Glands, AJCC 7th Edition - Clinical stage from 12/21/2015: Stage III (T3, N1, M0) - Signed by Derwood Kaplan, MD on 07/05/2021 Staged by: Staging assigned at another facility Diagnostic confirmation: Positive histology Specimen type: Excision Histopathologic type: Squamous cell carcinoma, NOS Stage prefix: Initial diagnosis Laterality: Right Histologic grade (G): G3 Residual tumor (R): R0 - None Extracapsular extension from lymph nodes for head & neck: Negative Extracapsular spread (ECS): No Stage used in treatment planning: Yes National guidelines used in  treatment planning: Yes Type of national guideline used in treatment planning: NCCN       INTERVAL HISTORY:  Sonia Side is here today for repeat clinical assessment. He denies fevers or chills. He denies pain. His appetite is good. His weight {Weight change:10426}.  REVIEW OF SYSTEMS:  Review of Systems  Constitutional:  Negative for appetite change, chills, fatigue, fever and unexpected weight change.  HENT:   Negative for lump/mass, mouth sores and sore throat.   Respiratory:  Negative for cough and shortness of breath.   Cardiovascular:  Negative for chest pain and leg swelling.  Gastrointestinal:  Negative for abdominal pain, constipation, diarrhea, nausea and vomiting.  Genitourinary:  Negative for difficulty urinating, dysuria, frequency and hematuria.   Musculoskeletal:  Negative for arthralgias, back pain and myalgias.  Skin:  Negative for itching, rash and wound.  Neurological:  Negative for dizziness, extremity weakness, headaches, light-headedness and numbness.  Hematological:  Negative for adenopathy.  Psychiatric/Behavioral:  Negative for depression and sleep disturbance. The patient is not nervous/anxious.      VITALS:  There were no vitals taken for this visit.  Wt Readings from Last 3 Encounters:  06/15/21 155 lb 9.6 oz (70.6 kg)  01/13/16 154 lb 8 oz (70.1 kg)  12/21/15 155 lb 3.3 oz (70.4 kg)    There is no height or weight on file to calculate BMI.  Performance status (ECOG): {CHL ONC [id number]  PHYSICAL EXAM:  Physical Exam Vitals and nursing note reviewed.  Constitutional:      General: He is not in acute distress.    Appearance: Normal appearance.  He is normal weight.  HENT:     Head: Normocephalic and atraumatic.     Mouth/Throat:     Mouth: Mucous membranes are moist.     Pharynx: Oropharynx is clear. No oropharyngeal exudate or posterior oropharyngeal erythema.  Eyes:     General: No scleral icterus.    Extraocular Movements: Extraocular  movements intact.     Conjunctiva/sclera: Conjunctivae normal.     Pupils: Pupils are equal, round, and reactive to light.  Cardiovascular:     Rate and Rhythm: Normal rate and regular rhythm.     Heart sounds: Normal heart sounds. No murmur heard.    No friction rub. No gallop.  Pulmonary:     Effort: Pulmonary effort is normal.     Breath sounds: Normal breath sounds. No wheezing, rhonchi or rales.  Abdominal:     General: Bowel sounds are normal. There is no distension.     Palpations: Abdomen is soft. There is no hepatomegaly, splenomegaly or mass.     Tenderness: There is no abdominal tenderness.  Musculoskeletal:        General: Normal range of motion.     Cervical back: Normal range of motion and neck supple. No tenderness.     Right lower leg: No edema.     Left lower leg: No edema.  Lymphadenopathy:     Cervical: No cervical adenopathy.     Upper Body:     Right upper body: No supraclavicular or axillary adenopathy.     Left upper body: No supraclavicular or axillary adenopathy.  Skin:    General: Skin is warm and dry.     Coloration: Skin is not jaundiced.     Findings: No rash.  Neurological:     Mental Status: He is alert and oriented to person, place, and time.     Cranial Nerves: No cranial nerve deficit.  Psychiatric:        Mood and Affect: Mood normal.        Behavior: Behavior normal.        Thought Content: Thought content normal.     LABS:      Latest Ref Rng & Units 10/10/2021   12:00 AM 06/15/2021   12:00 AM 12/21/2015   11:05 AM  CBC  WBC  3.8     4.0  6.0   Hemoglobin 13.5 - 17.5 12.8     12.9  14.5   Hematocrit 41 - 53 39     38  42.9   Platelets 150 - 400 K/uL 143     158  162      This result is from an external source.      Latest Ref Rng & Units 06/15/2021   12:00 AM 12/21/2015   11:05 AM  CMP  Glucose 65 - 99 mg/dL  99   BUN 4 - 21 27  23   $ Creatinine 0.6 - 1.3 0.8  0.88   Sodium 137 - 147 139  138   Potassium 3.4 - 5.3 4.7  4.2    Chloride 99 - 108 108  108   CO2 13 - 22 25  22   $ Calcium 8.7 - 10.7 8.7  8.9   Alkaline Phos 25 - 125 80    AST 14 - 40 26    ALT 10 - 40 20       No results found for: "CEA1", "CEA" / No results found for: "CEA1", "CEA" No results found for: "PSA1" No  results found for: "CAN199" No results found for: "CAN125"  No results found for: "TOTALPROTELP", "ALBUMINELP", "A1GS", "A2GS", "BETS", "BETA2SER", "GAMS", "MSPIKE", "SPEI" No results found for: "TIBC", "FERRITIN", "IRONPCTSAT" No results found for: "LDH"  STUDIES:  No results found.    HISTORY:   Past Medical History:  Diagnosis Date   Cancer [facility])    Skin cancer    right ear   Tinnitus     Past Surgical History:  Procedure Laterality Date   APPENDECTOMY     COLONOSCOPY     DIRECT LARYNGOSCOPY  12/21/2015   Procedure: DIRECT LARYNGOSCOPY;  Surgeon: Rozetta Nunnery, MD;  Location: [place] [facility];  Service: ENT;;   EYE SURGERY Bilateral    cataract with Lens   PAROTIDECTOMY Right 12/21/2015   Procedure: RIGHT PAROTIDECTOMY WITH FACIAL NERVE DISSECTION;  Surgeon: Rozetta Nunnery, MD;  Location: [facility];  Service: ENT;  Laterality: Right;   TONSILLECTOMY      No family history on file.  Social History:  reports that he quit smoking about 37 years ago. His smoking use included cigarettes. He has a 90.00 pack-year smoking history. He has never used smokeless tobacco. He reports current alcohol use of about 1.0 standard drink of alcohol per week. He reports that he does not use drugs.The patient is {Blank single:19197::"alone","accompanied by"} *** today.  Allergies: No Known Allergies  Current Medications: Current Outpatient Medications  Medication Sig Dispense Refill   Capsicum, Cayenne, (CAYENNE PO) Take by mouth.     Echinacea 125 MG CAPS Take by mouth daily.     PRESCRIPTION MEDICATION Take crestor unsure of mg dosage     saw palmetto 160 MG capsule Take 160 mg by mouth 3 (three) times daily with meals.      vitamin C (ASCORBIC ACID) 500 MG tablet Take 500 mg by mouth daily.     No current facility-administered medications for this visit.     ASSESSMENT & PLAN:   Assessment & Plan: SEABORN CARROLL is a 89 y.o. 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 This was treated with excision and radiation therapy. He remains without evidence of recurrence. With the history of radiation to the neck, we will continue to check a thyroid function.  The patient understands the plans discussed today and is in agreement with them.  He knows to contact our office if he develops concerns prior to his next appointment.     I provided *** minutes of face-to-face time during this encounter and > 50% was spent counseling as documented under my assessment and plan.    Marvia Pickles, PA-C  [facility] [address] [place] [address] Dept: [phone] Dept Fax: [phone]   No orders of the defined types were placed in this encounter.

## 2022-06-15 ENCOUNTER — Ambulatory Visit: Payer: Self-pay | Admitting: Hematology and Oncology

## 2022-06-15 ENCOUNTER — Inpatient Hospital Stay: Payer: Medicare Other

## 2022-09-01 DIAGNOSIS — I480 Paroxysmal atrial fibrillation: Secondary | ICD-10-CM | POA: Diagnosis not present

## 2022-09-02 DIAGNOSIS — C3490 Malignant neoplasm of unspecified part of unspecified bronchus or lung: Secondary | ICD-10-CM | POA: Diagnosis not present

## 2022-09-02 DIAGNOSIS — L03116 Cellulitis of left lower limb: Secondary | ICD-10-CM | POA: Diagnosis not present

## 2022-09-02 DIAGNOSIS — I480 Paroxysmal atrial fibrillation: Secondary | ICD-10-CM | POA: Diagnosis not present

## 2022-09-02 DIAGNOSIS — R7401 Elevation of levels of liver transaminase levels: Secondary | ICD-10-CM | POA: Diagnosis not present

## 2022-09-03 DIAGNOSIS — D61818 Other pancytopenia: Secondary | ICD-10-CM | POA: Diagnosis not present

## 2022-09-03 DIAGNOSIS — I480 Paroxysmal atrial fibrillation: Secondary | ICD-10-CM | POA: Diagnosis not present

## 2022-09-03 DIAGNOSIS — R7401 Elevation of levels of liver transaminase levels: Secondary | ICD-10-CM | POA: Diagnosis not present

## 2022-09-03 DIAGNOSIS — C3490 Malignant neoplasm of unspecified part of unspecified bronchus or lung: Secondary | ICD-10-CM | POA: Diagnosis not present

## 2022-09-29 DEATH — deceased

## 2024-01-27 NOTE — Telephone Encounter (Signed)
 Zachary Lin phoned into office and stated he has increased drainage of ears. Per Dr. Ethyl ,I will send Rx for Ciprodex  ear drops and doxcycline 100 BID x 1 week to [facility].
